# Patient Record
Sex: Male | Born: 1980 | Race: Black or African American | Hispanic: No | Marital: Single | Smoking: Current every day smoker
Health system: Southern US, Community
[De-identification: ages and names within clinical notes are randomized; demographics above are authoritative.]

## PROBLEM LIST (undated history)

## (undated) DIAGNOSIS — C801 Malignant (primary) neoplasm, unspecified: Secondary | ICD-10-CM

## (undated) DIAGNOSIS — I10 Essential (primary) hypertension: Secondary | ICD-10-CM

## (undated) DIAGNOSIS — N289 Disorder of kidney and ureter, unspecified: Secondary | ICD-10-CM

## (undated) DIAGNOSIS — F32A Depression, unspecified: Secondary | ICD-10-CM

## (undated) DIAGNOSIS — F419 Anxiety disorder, unspecified: Secondary | ICD-10-CM

## (undated) DIAGNOSIS — E119 Type 2 diabetes mellitus without complications: Secondary | ICD-10-CM

## (undated) HISTORY — DX: Depression, unspecified: F32.A

## (undated) HISTORY — DX: Anxiety disorder, unspecified: F41.9

---

## 2005-11-28 ENCOUNTER — Emergency Department (HOSPITAL_COMMUNITY): Admission: EM | Admit: 2005-11-28 | Discharge: 2005-11-29 | Payer: Self-pay | Admitting: Emergency Medicine

## 2007-03-28 ENCOUNTER — Inpatient Hospital Stay (HOSPITAL_COMMUNITY): Admission: EM | Admit: 2007-03-28 | Discharge: 2007-03-31 | Payer: Self-pay | Admitting: Emergency Medicine

## 2007-03-28 ENCOUNTER — Ambulatory Visit: Payer: Self-pay | Admitting: Oncology

## 2007-03-29 ENCOUNTER — Encounter (INDEPENDENT_AMBULATORY_CARE_PROVIDER_SITE_OTHER): Payer: Self-pay | Admitting: Interventional Radiology

## 2007-03-31 ENCOUNTER — Ambulatory Visit: Payer: Self-pay | Admitting: Oncology

## 2007-04-14 ENCOUNTER — Ambulatory Visit: Payer: Self-pay | Admitting: Thoracic Surgery

## 2007-04-21 ENCOUNTER — Encounter: Admission: RE | Admit: 2007-04-21 | Discharge: 2007-04-21 | Payer: Self-pay | Admitting: Thoracic Surgery

## 2007-05-05 ENCOUNTER — Ambulatory Visit: Payer: Self-pay | Admitting: Thoracic Surgery

## 2007-05-10 ENCOUNTER — Encounter: Payer: Self-pay | Admitting: Thoracic Surgery

## 2007-05-10 ENCOUNTER — Ambulatory Visit: Payer: Self-pay | Admitting: Thoracic Surgery

## 2007-05-10 ENCOUNTER — Inpatient Hospital Stay (HOSPITAL_COMMUNITY): Admission: RE | Admit: 2007-05-10 | Discharge: 2007-05-16 | Payer: Self-pay | Admitting: Thoracic Surgery

## 2007-05-20 ENCOUNTER — Ambulatory Visit: Payer: Self-pay | Admitting: Thoracic Surgery

## 2007-05-20 ENCOUNTER — Encounter: Admission: RE | Admit: 2007-05-20 | Discharge: 2007-05-20 | Payer: Self-pay | Admitting: Thoracic Surgery

## 2007-06-02 ENCOUNTER — Encounter: Admission: RE | Admit: 2007-06-02 | Discharge: 2007-08-31 | Payer: Self-pay | Admitting: Thoracic Surgery

## 2007-06-03 ENCOUNTER — Ambulatory Visit: Payer: Self-pay | Admitting: Thoracic Surgery

## 2007-06-03 ENCOUNTER — Encounter: Admission: RE | Admit: 2007-06-03 | Discharge: 2007-06-03 | Payer: Self-pay | Admitting: Thoracic Surgery

## 2007-06-04 ENCOUNTER — Ambulatory Visit: Payer: Self-pay | Admitting: Oncology

## 2007-06-30 ENCOUNTER — Encounter: Admission: RE | Admit: 2007-06-30 | Discharge: 2007-06-30 | Payer: Self-pay | Admitting: Thoracic Surgery

## 2007-06-30 ENCOUNTER — Ambulatory Visit: Payer: Self-pay | Admitting: Thoracic Surgery

## 2007-07-06 ENCOUNTER — Encounter: Admission: RE | Admit: 2007-07-06 | Discharge: 2007-08-04 | Payer: Self-pay | Admitting: Physician Assistant

## 2007-07-28 ENCOUNTER — Ambulatory Visit: Payer: Self-pay | Admitting: Thoracic Surgery

## 2007-07-28 ENCOUNTER — Encounter: Admission: RE | Admit: 2007-07-28 | Discharge: 2007-07-28 | Payer: Self-pay | Admitting: Thoracic Surgery

## 2007-09-03 ENCOUNTER — Ambulatory Visit: Payer: Self-pay | Admitting: Oncology

## 2007-10-05 ENCOUNTER — Ambulatory Visit: Payer: Self-pay | Admitting: Thoracic Surgery

## 2007-10-05 ENCOUNTER — Encounter: Admission: RE | Admit: 2007-10-05 | Discharge: 2007-10-05 | Payer: Self-pay | Admitting: Thoracic Surgery

## 2007-12-15 ENCOUNTER — Encounter: Admission: RE | Admit: 2007-12-15 | Discharge: 2007-12-15 | Payer: Self-pay | Admitting: Thoracic Surgery

## 2007-12-15 ENCOUNTER — Ambulatory Visit: Payer: Self-pay | Admitting: Thoracic Surgery

## 2008-03-03 ENCOUNTER — Ambulatory Visit: Payer: Self-pay | Admitting: Oncology

## 2008-06-21 ENCOUNTER — Ambulatory Visit: Payer: Self-pay | Admitting: Thoracic Surgery

## 2008-06-21 ENCOUNTER — Encounter: Admission: RE | Admit: 2008-06-21 | Discharge: 2008-06-21 | Payer: Self-pay | Admitting: Thoracic Surgery

## 2008-12-19 ENCOUNTER — Encounter: Admission: RE | Admit: 2008-12-19 | Discharge: 2008-12-19 | Payer: Self-pay | Admitting: Thoracic Surgery

## 2008-12-19 ENCOUNTER — Ambulatory Visit: Payer: Self-pay | Admitting: Thoracic Surgery

## 2009-03-15 IMAGING — CT CT BIOPSY
1 series · 16 of 30 positions shown, 20 images · non-contrast
Comparison: none

CLINICAL DATA: Wilms tumor. Resection of   thoracic metastasis 20 years ago.
Right apical mass.

CT-guided lung aspiration and core biopsy:
TECHNIQUE: Limited axial scans through the upper thorax were obtained and an
appropriate skin entry site was determined. Skin site was marked, prepped with
Betadine, and draped in usual sterile fashion, and infiltrated locally with 1%
lidocaine. Intravenous fentanyl and Versed were administered as conscious
sedation during continuous cardiorespiratory monitoring by the radiology RN.
Under intermittent CT guidance a 17-gauge   trocar needle was advanced to the
margin of the right apical lung mass using a posterior intercostal approach.
Once needle tip position was confirmed, coaxial 18-gauge core and 20-gauge
aspiration biopsy samples were obtained. An aspiration biopsy was then obtained
through the guiding needle itself. Postprocedure scans show no pneumothorax or
regional hemorrhage. Patient tolerated procedure well.

[Series 3: routine chest · axial · 0.56mm/px · z∈[-108,-18]mm · 16 of 66 slices shown, 20 images]
[im 3/66  mediastinal]
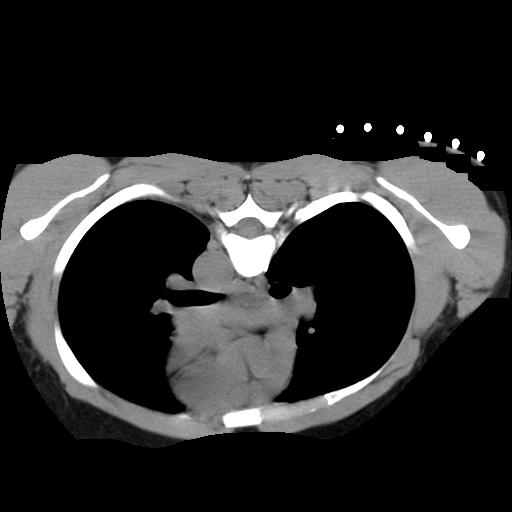
[im 3/66  lung]
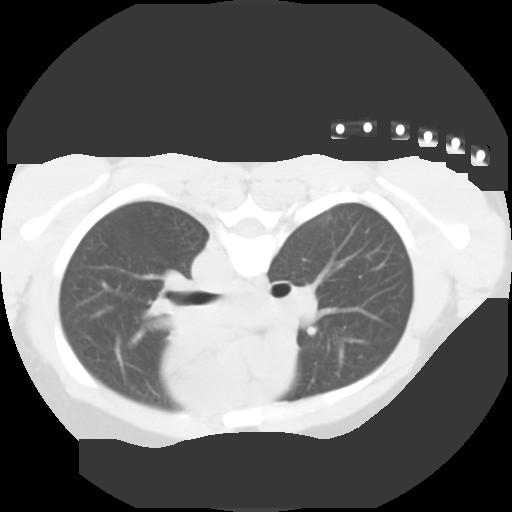
[im 8/66  lung]
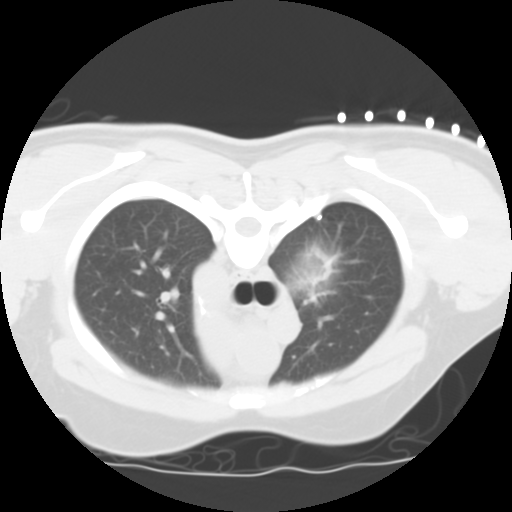
[im 13/66  lung]
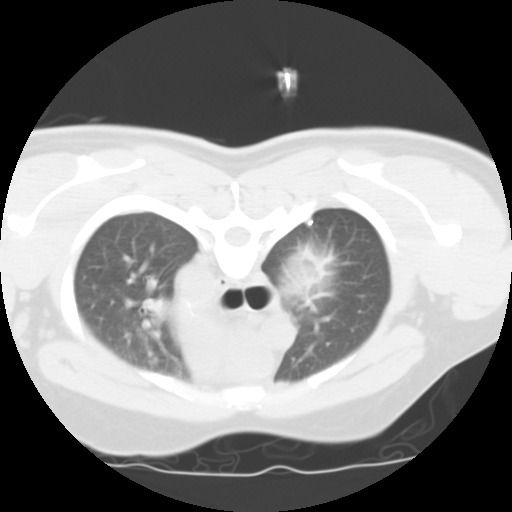
[im 15/66  lung]
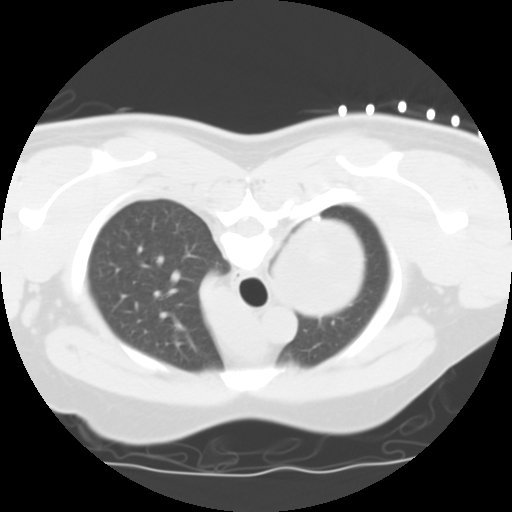
[im 17/66  mediastinal]
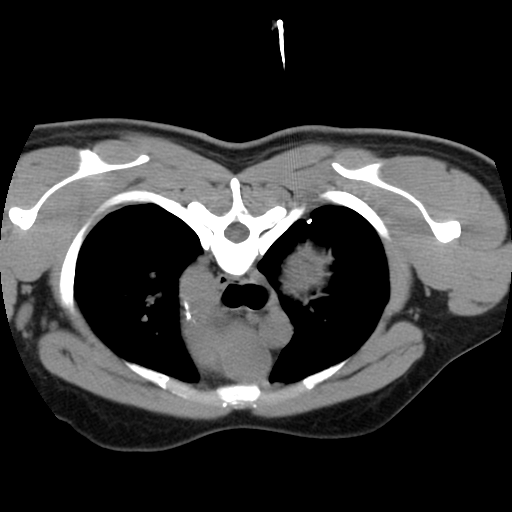
[im 17/66  lung]
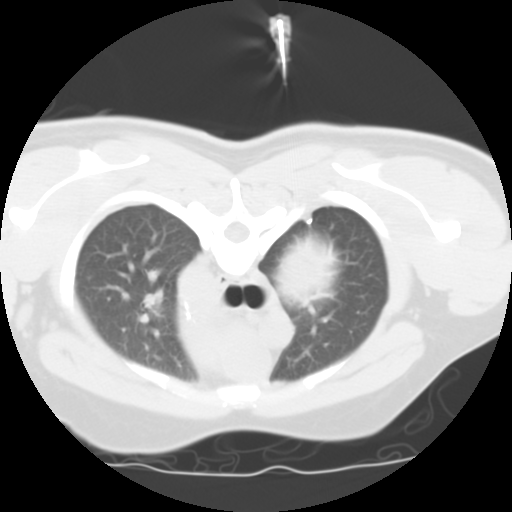
[im 22/66  lung]
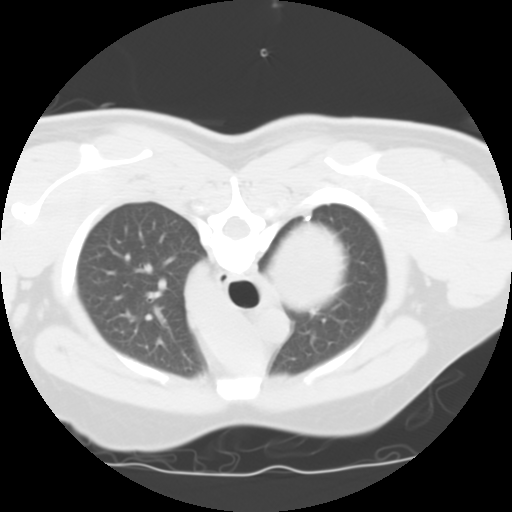
[im 27/66  lung]
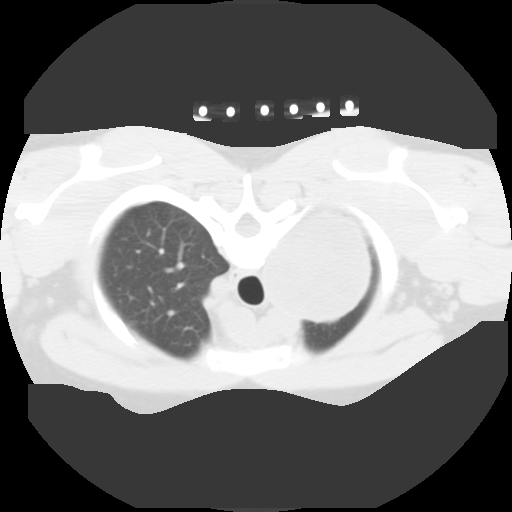
[im 32/66  lung]
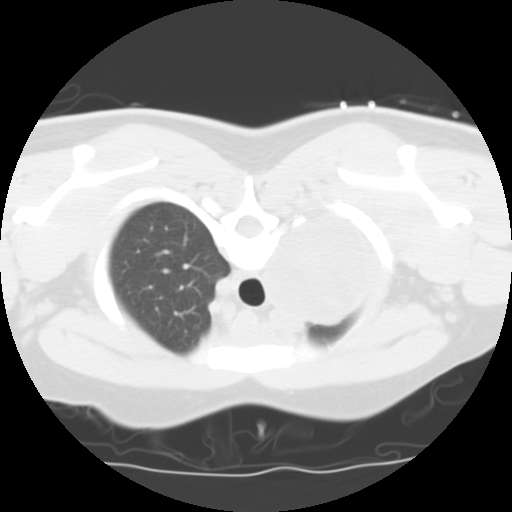
[im 34/66  mediastinal]
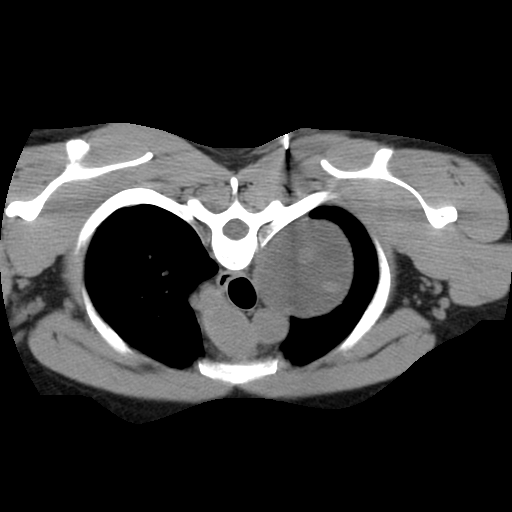
[im 34/66  lung]
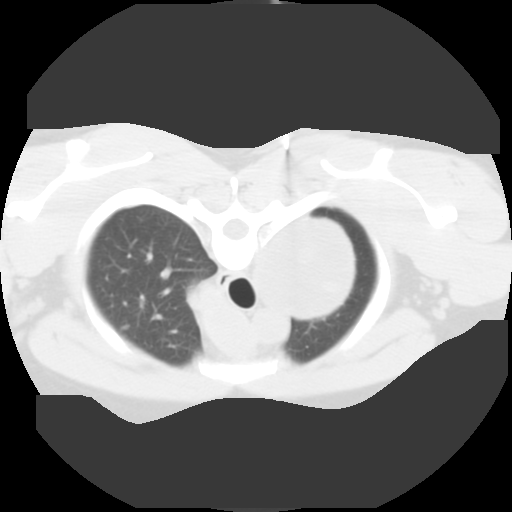
[im 39/66  lung]
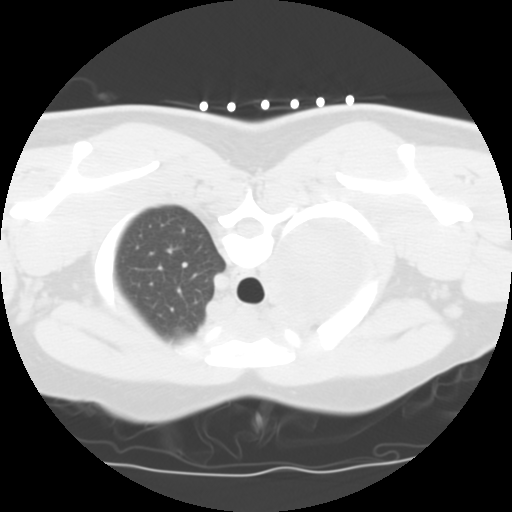
[im 41/66  lung]
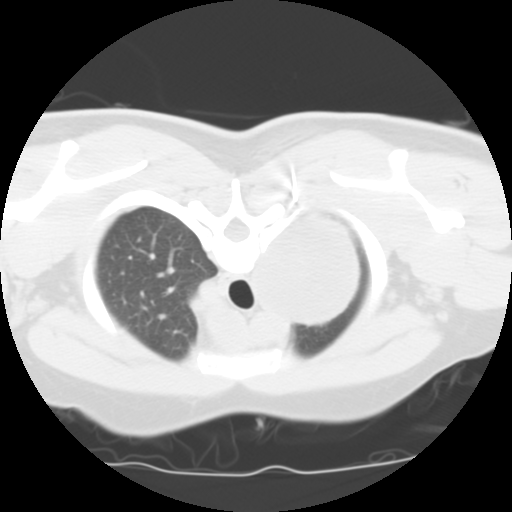
[im 46/66  lung]
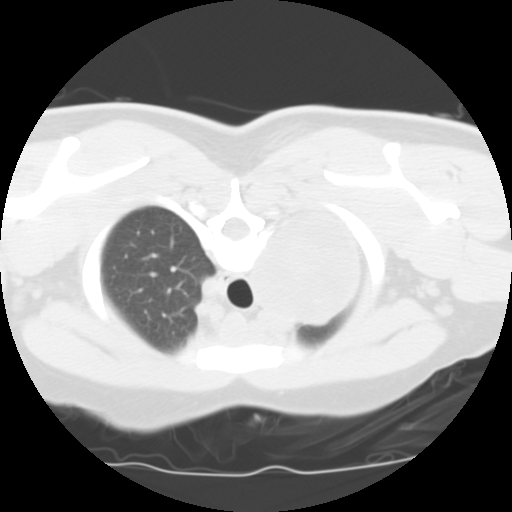
[im 49/66  mediastinal]
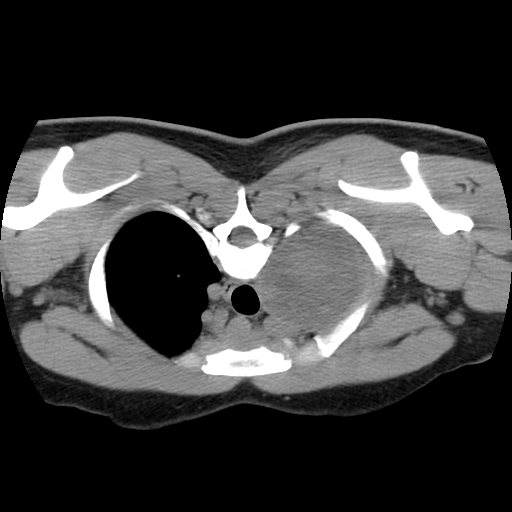
[im 49/66  lung]
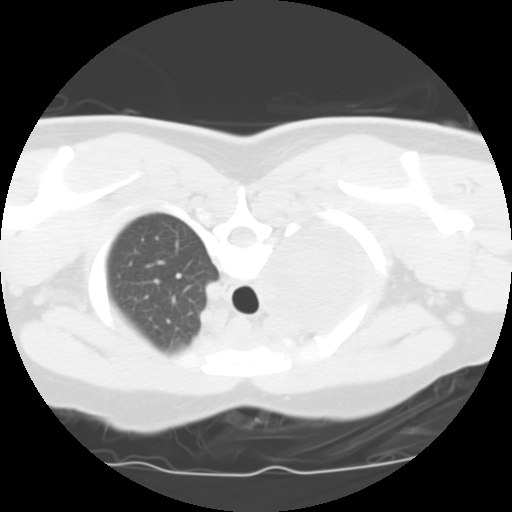
[im 53/66  lung]
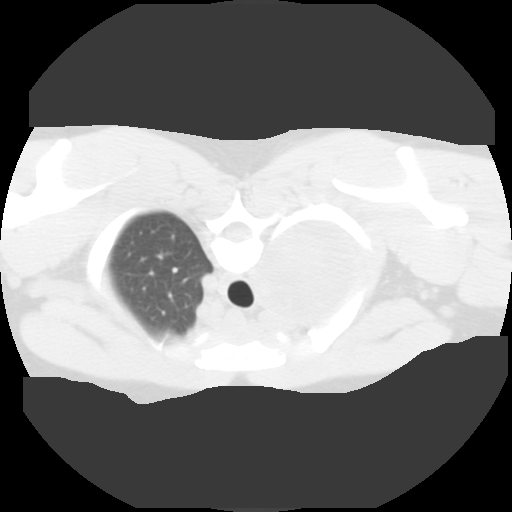
[im 58/66  lung]
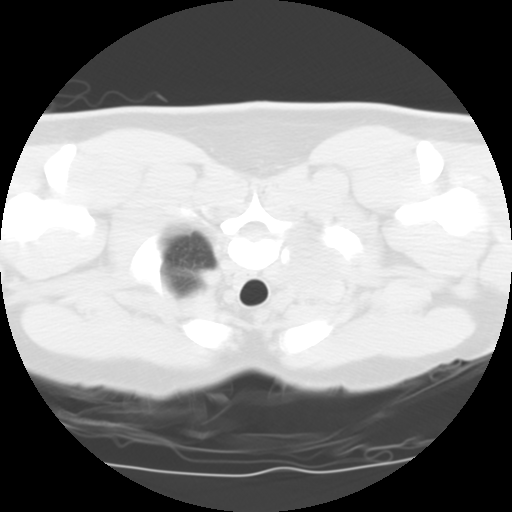
[im 63/66  lung]
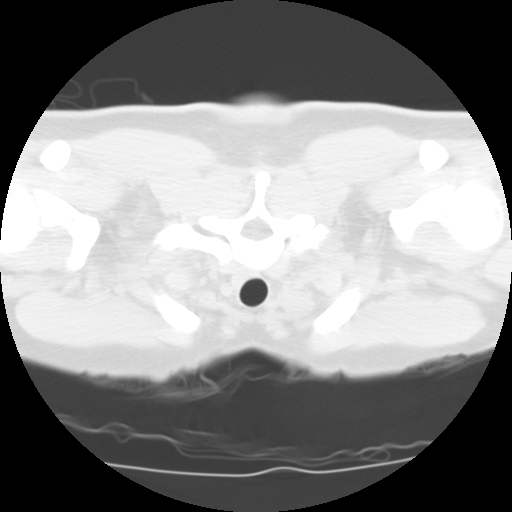

[16 of 30 positions shown; findings below may reference images not displayed]

IMPRESSION: 1. Technically successful CT guided aspiration and core lung biopsy

## 2009-03-16 IMAGING — CT CT ABDOMEN W/ CM
2 of 5 series · 16 of 46 positions shown, 18 images · IV contrast ([ID]/WATER & 100 ML OMNI 300)
Comparison: none

HISTORY: Right lung mass status post biopsy on [DATE], past history Manne
tumor, left nephrectomy, right thoracotomy, now with right flank pain

[Series 2: routine abdomen · axial · 0.70mm/px · z∈[-374,-54]mm · 13 of 72 slices shown, 15 images]
[im 4/72  soft-tissue]
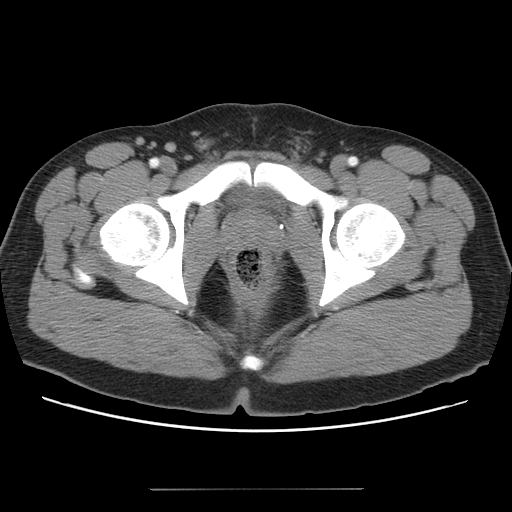
[im 4/72  bone]
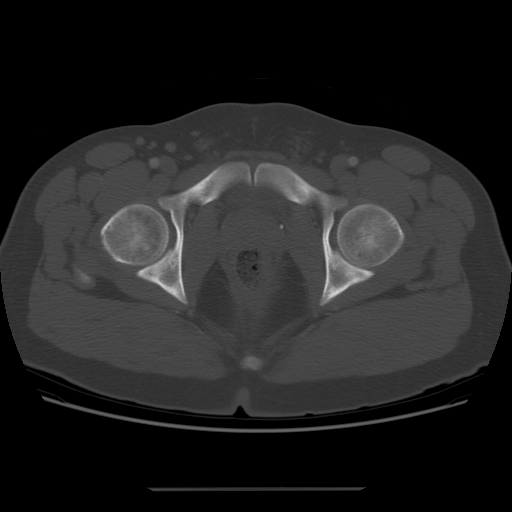
[im 8/72  soft-tissue]
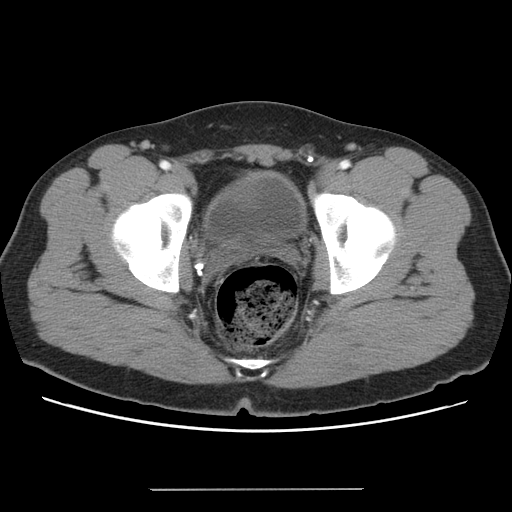
[im 16/72  soft-tissue]
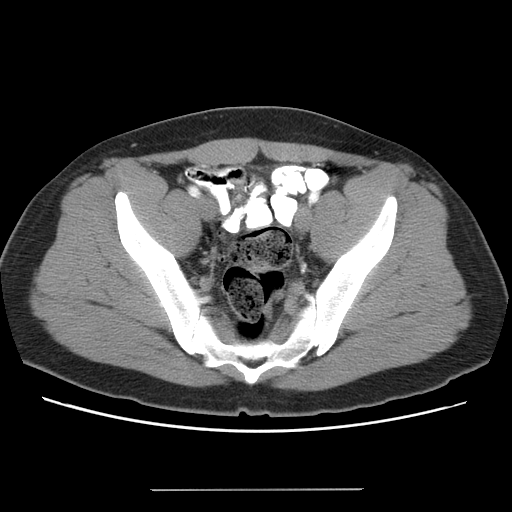
[im 20/72  soft-tissue]
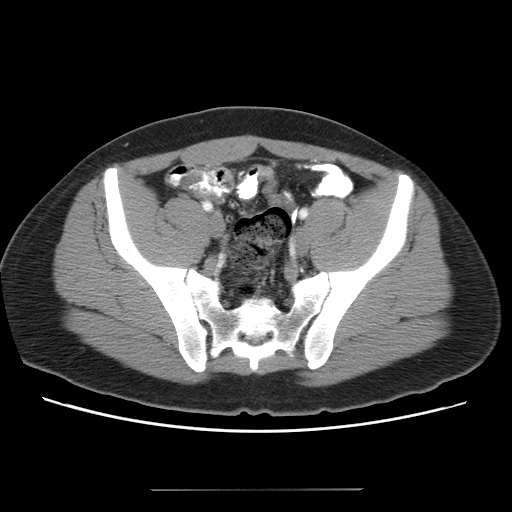
[im 24/72  soft-tissue]
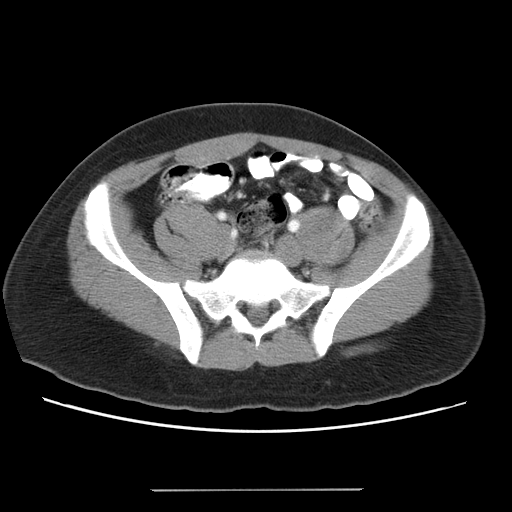
[im 32/72  soft-tissue]
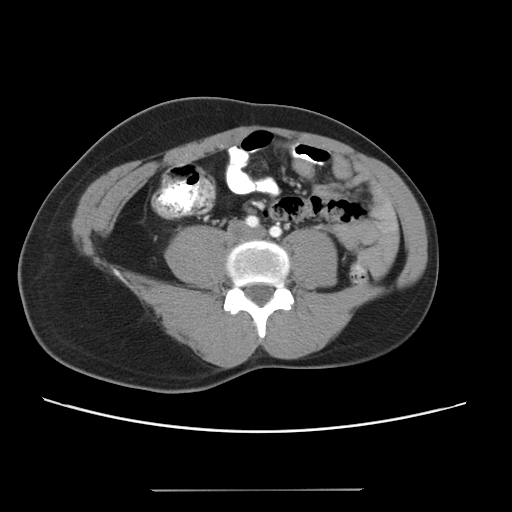
[im 36/72  soft-tissue]
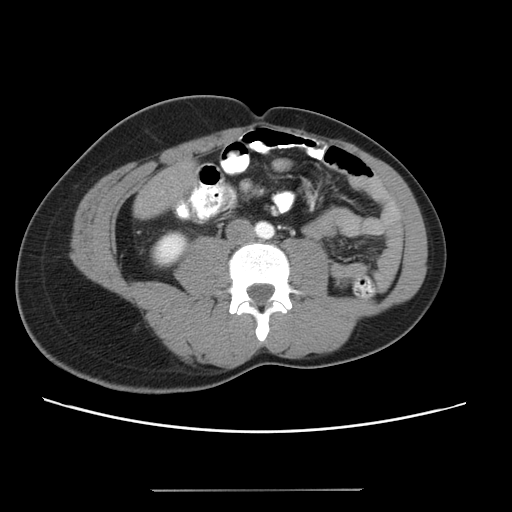
[im 40/72  soft-tissue]
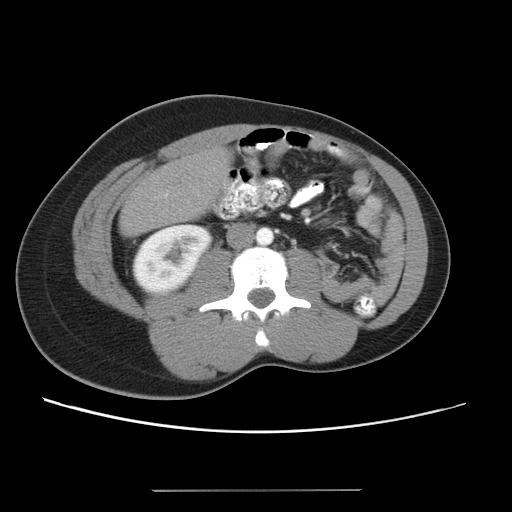
[im 48/72  soft-tissue]
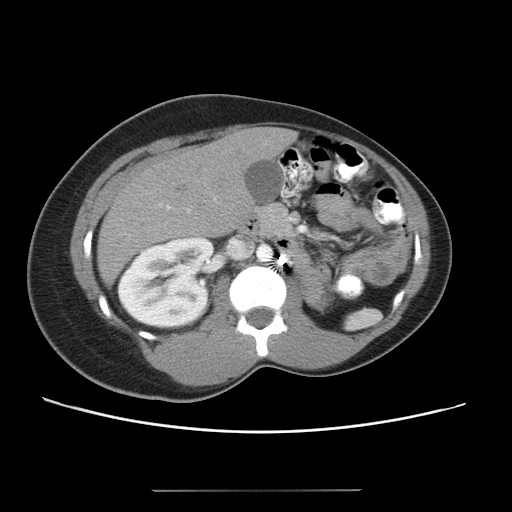
[im 48/72  bone]
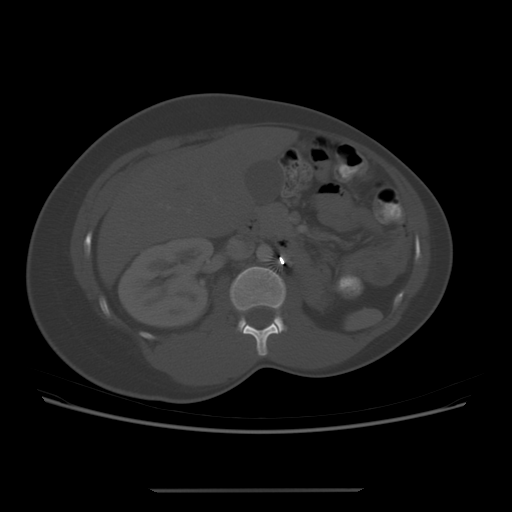
[im 52/72  soft-tissue]
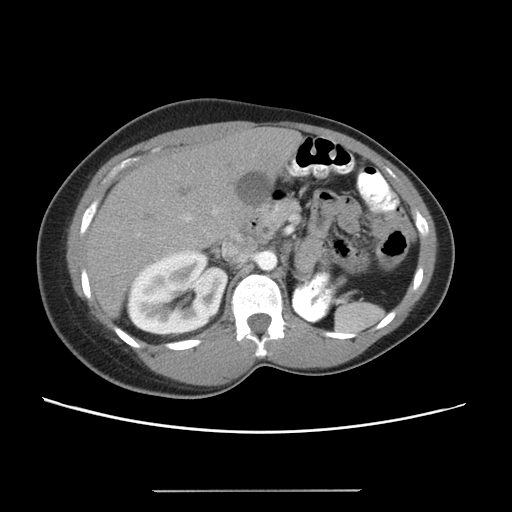
[im 56/72  soft-tissue]
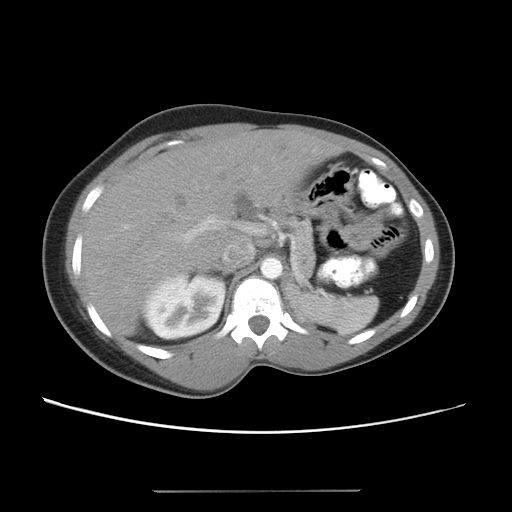
[im 64/72  soft-tissue]
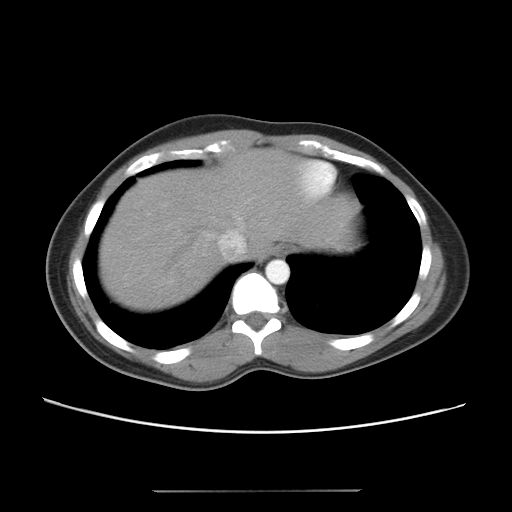
[im 68/72  soft-tissue]
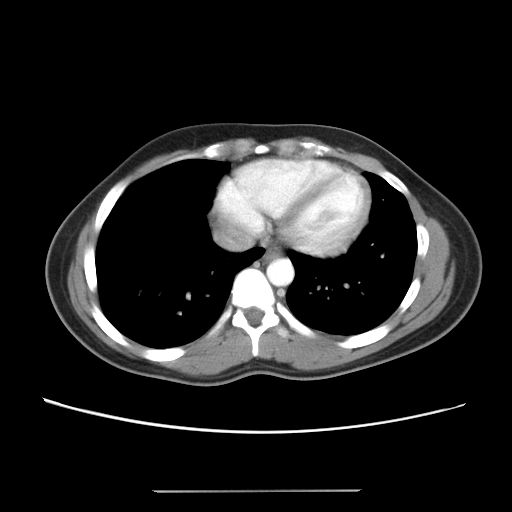

[Series 401: reformatted · coronal · 0.82mm/px · 3 of 101 slices shown]
[im 34/101  soft-tissue]
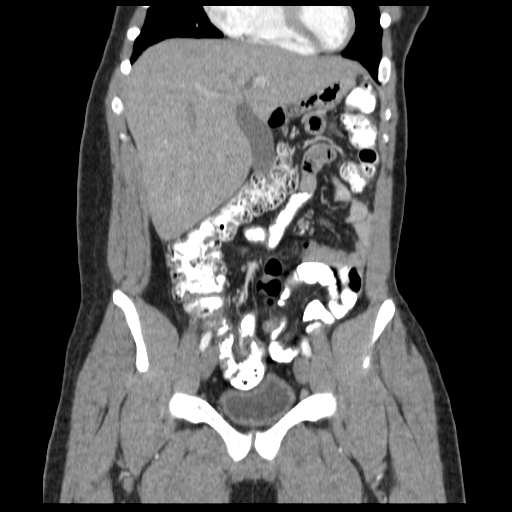
[im 45/101  soft-tissue]
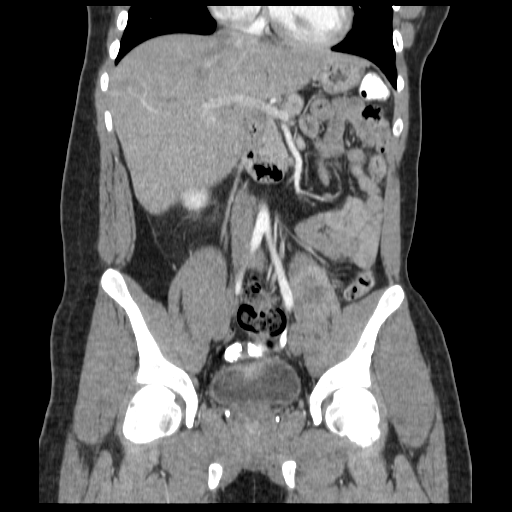
[im 56/101  soft-tissue]
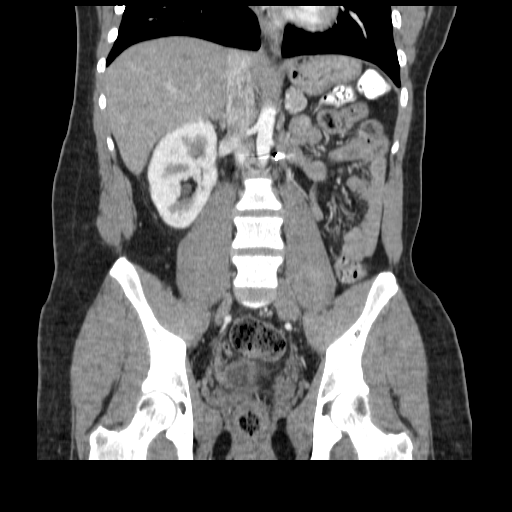

[16 of 46 positions shown; findings below may reference images not displayed]

CT ABDOMEN AND PELVIS WITH CONTRAST:

Multidetector helical CT imaging abdomen and pelvis performed.
Sagittal and coronal images are reconstructed from the axial data set.
Exam utilized dilute oral contrast and 100 cc Qmnipaque-CXX.
Correlation made with prior CT chest of 03/28/2007.

CT ABDOMEN:

Lung bases clear.
Status post left nephrectomy.
Poorly defined area of hypervascular enhancement identified posterior aspect
right lobe of liver, 14 x 14 mm image 13, nonspecific.
This could represent an atypical meningioma or hypervascular tumor/metastatic
focus.
This requires further characterization.
Remainder of liver, spleen, pancreas, and left adrenal gland normal.
Right adrenal gland is not definitely visualized, though there is prolapse of
tail of pancreas and colon into left renal fossa, which could obscure a left
adrenal gland.
Stomach and upper abdominal bowel loops otherwise normal appearance.
Compensatory hypertrophy of right kidney without focal mass or hydronephrosis.
No upper abdominal mass, adenopathy, free fluid, or inflammatory process. 
Few scattered normal sized mesenteric lymph nodes.
No ureteral calcification or dilatation.
IMPRESSION: Status post left nephrectomy with compensatory hypertrophy right kidney.
Hypervascular mass posterior aspect right lobe liver, 1.4 x 1.4 x 1.6 cm,
question atypical hemangioma versus hypervascular mass/tumor/metastasis.
Characterization of this lesion by multiphase MR imaging of the abdomen with and
without contrast recommended.
No other upper abdominal abnormalities.

CT PELVIS:

No distal ureteral calcification or dilatation.
Bladder incompletely distended, wall minimally prominent.
Bilateral pelvic phleboliths.
Increased stool rectosigmoid colon.
Normal appendix.
Large and small bowel loops otherwise unremarkable.
No pelvic mass, adenopathy, free fluid, or inflammatory process.
No bone lesions.
IMPRESSION: Mild constipation.
No other pelvic abnormalities.

## 2009-09-14 ENCOUNTER — Ambulatory Visit: Payer: Self-pay | Admitting: Thoracic Surgery

## 2010-10-17 ENCOUNTER — Emergency Department (HOSPITAL_COMMUNITY)
Admission: EM | Admit: 2010-10-17 | Discharge: 2010-10-17 | Payer: Self-pay | Source: Home / Self Care | Admitting: Family Medicine

## 2010-10-20 ENCOUNTER — Encounter: Payer: Self-pay | Admitting: Thoracic Surgery

## 2010-11-10 ENCOUNTER — Emergency Department (HOSPITAL_COMMUNITY)
Admission: EM | Admit: 2010-11-10 | Discharge: 2010-11-10 | Disposition: A | Discharge status: Home / Self Care | Payer: 59 | Attending: Emergency Medicine | Admitting: Emergency Medicine

## 2010-11-10 DIAGNOSIS — Y929 Unspecified place or not applicable: Secondary | ICD-10-CM | POA: Insufficient documentation

## 2010-11-10 DIAGNOSIS — S61209A Unspecified open wound of unspecified finger without damage to nail, initial encounter: Secondary | ICD-10-CM | POA: Insufficient documentation

## 2010-11-10 DIAGNOSIS — Z79899 Other long term (current) drug therapy: Secondary | ICD-10-CM | POA: Insufficient documentation

## 2010-11-10 DIAGNOSIS — Z85528 Personal history of other malignant neoplasm of kidney: Secondary | ICD-10-CM | POA: Insufficient documentation

## 2010-11-10 DIAGNOSIS — W268XXA Contact with other sharp object(s), not elsewhere classified, initial encounter: Secondary | ICD-10-CM | POA: Insufficient documentation

## 2011-02-11 NOTE — Discharge Summary (Signed)
[name], [name]                  ACCOUNT NO.:  [id number]   MEDICAL RECORD NO.:  [id number]          PATIENT TYPE:  INP   LOCATION:  2025                         FACILITY:  [facility]   PHYSICIAN:  [name], M.D. DATE OF BIRTH:  [birth year]   DATE OF ADMISSION:  05/10/2007  DATE OF DISCHARGE:  05/14/2007                               DISCHARGE SUMMARY   HISTORY OF PRESENT ILLNESS:  The patient is a 30 year old black male who  was seen in thoracic surgical consultation by Dr. Edwyna Shell for a right  lung mass.  The patient has a known history of previous Wilms' tumor,  status post left nephrectomy and right thoracotomy.  He is also status  post chemotherapy.  Recently, he developed right flank pain, and was  seen in the emergency room.  A chest x-ray and confirming CT scan  revealed right apical mass that was 6 to 8 cm in size, which evidenced  no bony destruction.  A needle biopsy was done, and this revealed an  atypical tumor, probably schwannoma.  The patient was admitted this  hospitalization for further resection.   PAST MEDICAL HISTORY:  Negative with the exception of the above.   MEDICATIONS PRIOR TO ADMISSION:  None.   Family history, social history, review of systems, and physical exam:  Please see the history and physical done at the time of admission.   HOSPITAL COURSE:  The patient was admitted electively and on May 07, 2007, taken to the operating room, at which time he underwent a right  thoracotomy with resection of a schwannoma or apical tumor.   POSTOPERATIVE HOSPITAL COURSE:  The patient has overall done well.  All  routine lines, monitors and drainage devices have been discontinued in  the standard fashion.  The patient has had some right shoulder  discomfort with difficulty moving.  This has shown a  slow progress.  He  will be seen by occupational therapy prior to discharge.  Pathology does  reveal a schwannoma.  The specimen evidenced no evidence of  malignancy.  The patient's oxygen has been weaned, and he maintains adequate  saturations on room air.  His chest x-ray is felt to be improving in an  adequate manner consistent with postoperative changes, and had been  reviewed by Dr. Edwyna Shell on a daily basis. His overall status is felt to  be tentatively stable for discharge on the morning on May 15, 2007  pending morning round reevaluation.   DISCHARGE MEDICATIONS:  Tylox 1-2 q.4-6 hours as needed.   DISCHARGE INSTRUCTIONS:  The patient will receive written instructions  in regard to medications, activity, diet, wound care and follow up.   FOLLOWUP:  Will include Dr. Edwyna Shell on Thursday, August 21 at 3:45 with a  chest x-ray to be obtained prior to this appointment.   FINAL DIAGNOSIS:  Right lung schwannoma status post resection.   OTHER DIAGNOSES:  Include:  1. Previous history of Wilms' tumor.  2. History of tobacco abuse.      [name], P.A.-C.      [name],  M.D.  Electronically Signed    WEG/MEDQ  D:  05/14/2007  T:  05/15/2007  Job:  [id number]

## 2011-02-11 NOTE — H&P (Signed)
[name], [name]                  ACCOUNT NO.:  [id number]   MEDICAL RECORD NO.:  [id number]          PATIENT TYPE:  INP   LOCATION:  NA                           FACILITY:  [facility]   PHYSICIAN:  [name], M.D. DATE OF BIRTH:  June 22, 1981   DATE OF ADMISSION:  DATE OF DISCHARGE:                              HISTORY & PHYSICAL   HISTORY OF PRESENT ILLNESS:  Right lung mass.   This 30 year old [place] American male at age had a left nephrectomy and  right thoracotomy for Wilms tumor.  He had metastatic disease to his  right chest.  Recently, he was complaining of right flank pain and went  to the emergency room and had a chest x-ray and then a CT scan that  showed a right apical mass that was 6-8 cm in size with no bony  destruction.  Needle biopsy was done and showed an atypical tumor,  probably an atypical schwannoma.  He has no neurological symptoms, no  dyspnea, no problems with bleeding.  He had a urinary tract infection  but that is resolved.   PAST MEDICAL HISTORY:  Negative except for above.  He is on no  medications.   SOCIAL HISTORY:  He smokes 10-15 cigarettes a day.  Does not drink  alcohol on a regular basis.  He is married.   FAMILY HISTORY:  He works for the city in [employer].   FAMILY HISTORY:  Negative for cancer and vascular disease.  He has four  children.   REVIEW OF SYSTEMS:  His weight has been stable.  CARDIAC:  No angina or  atrial fibrillation.  PULMONARY:  No hemoptysis or respiratory  infections.  He says he has brothers and sisters that do have asthma.  GI:  No nausea, vomiting, constipation, diarrhea, or GERD.  GU: See  history of present illness.  He has a hypertrophic right kidney.  VASCULAR:  No claudication, DVT, TIAs.  NEUROLOGICAL:  No headaches,  blackouts or seizures.  MUSCULOSKELETAL:  No joint pain or rash.  PSYCHIATRIC:  No psychiatric illness or nervousness.  HEMATOLOGIC:  No  problems with clotting or bleeding.   PHYSICAL EXAMINATION:  He is a well-developed [place] male in  no acute distress.  His blood pressure is 115/74, pulse 86, respirations  18, saturations were 96%.  HEENT:  Head:  Atraumatic.  Eyes:  Pupils equal, reactive to light and  accommodation.  Extraocular movements normal.  Ears:  Tympanic membranes  are intact.  Nose:  There is no septal deviation.  Mouth:  Without  lesions.  Uvula is in the midline.  NECK:  Supple without thyromegaly.  There is no supraclavicular or  axillary adenopathy.  CHEST:  Decreased breath sounds in the right upper lobe, normal breath  sounds the right lower lobe and the left chest.  HEART:  Regular sinus rhythm, no murmurs.  There is a right thoracotomy  scar.  ABDOMEN:  Soft.  There is no hepatosplenomegaly.  There are surgical  scars.  EXTREMITIES:  Pulses are 2+.  There is no clubbing or edema.  NEUROLOGICAL:  He is oriented x3.  Sensory and motor intact.  Cranial  nerves are intact.   IMPRESSION:  1. Probable schwannoma.  2. History of Wilms tumor status post resection and chemotherapy.      [name], M.D.  Electronically Signed     DPB/MEDQ  D:  05/06/2007  T:  05/07/2007  Job:  [id number]

## 2011-02-11 NOTE — Letter (Signed)
May 20, 2007   Leighton Roach. Truett Perna, M.D.  [address]- [facility]  [place] [address]   Re:  [name], [name]              DOB:  [birth year]   Dear Dr. Nida Boatman,   [name] came for followup today.  His chest x-ray is improved.  Removed  his sutures.  He is still having a lot of pain.  His blood pressure was  123/80, pulse 100, respirations 18, sats were 97%.   Overall, he is doing reasonably well.  He feels he is moving his  shoulder better than what he was in the hospital, but he has not had a  bowel movement, so we told him to try to cut down on his pain medication  and take a laxative.  Continue his work with his shoulder.   Plan to see him back again in two weeks with a chest x-ray.   [name], M.D.  Electronically Signed   DPB/MEDQ  D:  05/20/2007  T:  05/21/2007  Job:  [id number]

## 2011-02-11 NOTE — Assessment & Plan Note (Signed)
OFFICE VISIT   [name], [name]  DOB:  Aug 28, 1981                                        June 03, 2007  CHART #:  [id number]   Blood pressure is 122/73, pulse 82, respirations 18, and SATs were 98%.  His incision was well healed.  He works with physical therapy for his  neck pain and strength of his right arm and able to abduct his right arm  to approximately 90 degrees.  He is still having a moderate amount of  postthoracotomy pain, and we refilled his Lortab 7.5/500.  I have  advised him to continue his physical therapy.  I plan to see him back  again in four weeks to see if he is not strong enough to return to his  job given the amount of physical labor involved.   [name], M.D.  Electronically Signed   DPB/MEDQ  D:  06/03/2007  T:  06/03/2007  Job:  [id number]

## 2011-02-11 NOTE — Discharge Summary (Signed)
NAMECLEOTHA, TSANG                  ACCOUNT NO.:  [id number]   MEDICAL RECORD NO.:  [id number]          PATIENT TYPE:  INP   LOCATION:  5730                         FACILITY:  [facility]   PHYSICIAN:  Barnetta Chapel, MDDATE OF BIRTH:  12/09/1980   DATE OF ADMISSION:  03/28/2007  DATE OF DISCHARGE:  03/31/2007                               DISCHARGE SUMMARY   PRIMARY CARE PHYSICIAN:  Dr. Flonnie Overman.   DIAGNOSES:  1. Right upper lobe mass.  2. Right upper quadrant discomfort.   DISCHARGE MEDICATIONS:  1. Dilaudid 4 mg p.o. q.6h. p.r.n.  2. Phenergan 12.5 mg p.o. q.6h. p.r.n. nausea and vomiting.   CONSULTATIONS:  Oncology consult.   PROCEDURES:  CT guided biopsy of the right upper lobe mass.  The final  pathology is still pending.  The sample had to be sent out.  The primary  care Alejandro Gamel should please follow up with the results of the biopsy.   IMAGING STUDIES:  1. Chest x-ray that revealed right upper lobe mass.  2. CT scan of the abdomen and pelvis that revealed 14 x 14 mm lesion      at the right lobe of the liver.  This could be an  __________      hemangioma, hypervascular tumor, or metastasis.  There will be need      for an MRI as an outpatient.  The patient will also need a PET scan      as an outpatient to further evaluate the right upper lobe mass.   BRIEF HISTORY AND HOSPITAL COURSE:  Please refer to the H&P done on March 28, 2007.  The patient is a 30 year old male with a history of Wilms  tumor.  The patient had a left nephrectomy, thoracotomy, chemotherapy,  and radiation at [facility] for the Wilms tumor.  The patient has  been in fairly good state of health until recently when he presented  with low-grade fever and myalgias.  The chest x-ray done revealed a  right upper lobe mass.   The patient was admitted to the regular medical floor.  CT guided biopsy  was done.  The final pathology result is still pending.  The patient  also reported right upper quadrant  discomfort that comes on and off.  CAT scan of the abdomen and pelvis done was essentially as above.  There  will be need for an MRI to further characterize the lesion on the right  lobe of the liver.  The patient will also need a PET scan as part of the  workup for the right upper lobe mass.  Prior to presentation, the  patient was on Augmentin 875 mg p.o. b.i.d.  The antibiotic was  continued during his stay in the hospital.  The fever has resolved.  The  fatigue has also resolved.  The patient has done well and will be  discharged back home today to the care of the primary care Yerlin Gasparyan, Dr.  Flonnie Overman.   DISCHARGE PLANS:  1. Discharge the patient home today.  2. Follow up with the oncologist as soon as  possible.  3. Follow up with the primary care Tomeshia Pizzi, Dr. Flonnie Overman in a week.  4. The primary care Amos Gaber should please pursue the final report of      the pathology.  5. The patient will need an MRI of the liver as an outpatient.  He      will also need a PET scan as an outpatient.   DIET:  Regular diet.   ACTIVITY:  As tolerated.      Barnetta Chapel, MD  Electronically Signed     SIO/MEDQ  D:  03/31/2007  T:  03/31/2007  Job:  [id number]   cc:   Lucita Ferrara, MD  Leighton Roach. Truett Perna, M.D.

## 2011-02-11 NOTE — Assessment & Plan Note (Signed)
OFFICE VISIT   DEWEL, LOTTER  DOB:  07-19-1981                                        October 05, 2007  CHART #:  [id number]   Mr. Hanna came for follow up today.  He still has some numbness in his  right arm and some pain there, but his chest x-ray is stable with no  evidence of recurrence of his schwannoma.  I did start him on Neurontin  300 mg twice a day, and to go to 600 mg twice a day, to see if this is  helping him for his pain and numbness, but his overall strength is doing  better and he has gone back to work.  I will see him again in 2 month  for further follow up.  His blood pressure was 116/78, pulse 80,  respirations 18, saturations were 99%.   [name], M.D.  Electronically Signed   DPB/MEDQ  D:  10/05/2007  T:  10/06/2007  Job:  [id number]

## 2011-02-11 NOTE — Assessment & Plan Note (Signed)
OFFICE VISIT   KHAMBREL, AMSDEN  DOB:  05-02-1981                                        December 15, 2007  CHART #:  [id number]   Mr. Oran comes in today for two-month followup.  He is status post a  right thoracotomy with resection of schwannoma on May 10, 2007.  He  has done well since his last office visit.  He states that he continues  to have some pain in his elbow, particularly with lifting weights or  when he has been doing heavy lifting, but overall this is much improved  from his last visit.  He is still having some numbness, as well, but is  no longer taking the Neurontin.  He has no new complaints today.   Vital signs:  Blood pressure 124/80, pulse 66, respirations 18, O2 sat  97%.  On exam, his incision has healed well.  Lungs:  Clear to  auscultation.  Heart:  Regular rate and rhythm.   Chest x-ray has remained stable.  There is no evidence of recurrence of  tumor.   ASSESSMENT AND PLAN:  Dr. Edwyna Shell has also seen the patient today and we  will plan to see him back again in the office in two months with a chest  x-ray.  He is asked to call in the interim if he experiences problems or  has questions.   [name], M.D.  Electronically Signed   GC/MEDQ  D:  12/15/2007  T:  12/15/2007  Job:  [id number]   cc:   Dr. Georgina Pillion, [place] [facility], [place]

## 2011-02-11 NOTE — Assessment & Plan Note (Signed)
OFFICE VISIT   [name], [name]  DOB:  08-24-1981                                        June 30, 2007  CHART #:  [id number]   [name] came back complaining that he is still having a moderate amount  of chest pain and hydrocodone is ineffective.  He is on 5/500.  His  chest x-ray showed normal postoperative changes.  He still has numbness  in the on the outer aspect of his arm and really some pain in his chest  which is more post thoracotomy pain.  He is still undergoing physical  therapy and this seems to be helping him.  Because of the slow recovery,  I will give him off until November 15th prior to returning to work. I  told him to gradually try and increase his activity and increase his use  of his right arm.  I plan to see him back again in about 4 weeks with a  chest x-ray.   [name], M.D.  Electronically Signed   DPB/MEDQ  D:  06/30/2007  T:  07/01/2007  Job:  [id number]

## 2011-02-11 NOTE — Assessment & Plan Note (Signed)
OFFICE VISIT   BENEDICT, KUE  DOB:  [birth year]                                        June 21, 2008  CHART #:  [id number]   The patient's blood pressure was 129/88, pulse 71, respirations 18, and  sats were 99%.  Chest x-ray showed no evidence of recurrence.  There has  been years since his surgery.  He is doing well overall.  I will see him  again in 6 months with another chest x-ray.  He still has some mild pain  and numbness in his arm and some weakness, but overall, it continues to  improve.  We will see him back again in 6 months with a chest x-ray.   [name], M.D.  Electronically Signed   DPB/MEDQ  D:  06/21/2008  T:  06/21/2008  Job:  [id number]

## 2011-02-11 NOTE — Op Note (Signed)
[name], [name]                  ACCOUNT NO.:  [id number]   MEDICAL RECORD NO.:  [id number]          PATIENT TYPE:  INP   LOCATION:  3302                         FACILITY:  [facility]   PHYSICIAN:  [name], M.D. DATE OF BIRTH:  1981-06-23   DATE OF PROCEDURE:  05/10/2007  DATE OF DISCHARGE:                               OPERATIVE REPORT   PREOPERATIVE DIAGNOSIS:  Right posterior mediastinal apical tumor,  possible schwannoma.   POSTOPERATIVE DIAGNOSIS:  Schwannoma, T1-T2 area.   OPERATION PERFORMED:  1. Right thoracotomy  2. Resection of schwannoma or apical tumor.   SURGEON:  D.P. Edwyna Shell, MD   FIRST ASSISTANT:  Zenaida Niece, RNFA   ANESTHESIA:  General anesthesia.   DESCRIPTION OF PROCEDURE:  After percutaneous insertion of all  monitoring lines, the patient was turned to the right lateral  thoracotomy position.  A dual-lumen tube was inserted.  This patient had  a previous thoracotomy incision for metastatic Wilms tumor at age 45 and  we went through the same incision and extended posteriorly up  approximately 2 cm in the parascapular area.  The latissimus was divided  with electrocautery.  The serratus was reflected anteriorly.  The  rhomboid was divided with electrocautery and the scapula was elevated.  We dissected down to the 4th intercostal space and then went into the  4th intercostal space and at that time, there were a lot of adhesions.  We took the adhesions down off the 4th intercostal space with  electrocautery, but had to take the adhesions off the inferior portion  down with the Autosuture 45 stapler with 3 applications.  After the  adhesions had been taken down, we then took the lung off of the apical  tumor, which was readily apparent, taking it off with sharp and blunt  dissection.  One area of the tumor of the lung had to be stapled with a  no-knife 45 stapler.  Then we started the dissection of the apical  tumor, resecting it off the pleura,  dissecting it up superiorly,  inferiorly, medially and laterally until we reached the apex.  We were  able to get around the apex and strip the tumor down.  There was some  bleeding from a paravertebral vein and this was controlled with  electrocautery and then direct pressure.  The tumor was finally  dissected up and removed.  In order to get a good dissection of this, we  did also have to go through the 3rd intercostal space in order to free  this up by sharp and blunt dissection.  After the tumor had been  removed, all bleeding was electrocoagulated.  Some FloSeal was applied  to the area where the paravertebral vein was. Two chest tubes were  brought in through separate stab wounds and tied in place with 0 silk.  A single On-Q was placed in the  usual fashion.  A Marcaine block was done in the usual fashion.  Pericostal were used to close the 3rd and the 4th intercostal space, #1  Vicryl in the muscle layer, 2-0 Vicryl in the subcutaneous  tissue and  Ethicon skin clips.  The patient was returned to the recovery room in  stable condition.      [name], M.D.  Electronically Signed     DPB/MEDQ  D:  05/10/2007  T:  05/11/2007  Job:  [id number]   cc:   Leighton Roach. Truett Perna, M.D.

## 2011-02-11 NOTE — H&P (Signed)
NAMEALP, GOLDWATER                  ACCOUNT NO.:  [id number]   MEDICAL RECORD NO.:  [id number]          PATIENT TYPE:  INP   LOCATION:  5730                         FACILITY:  [facility]   PHYSICIAN:  Corinna L. Lendell Caprice, MDDATE OF BIRTH:  09-25-1981   DATE OF ADMISSION:  03/28/2007  DATE OF DISCHARGE:                              HISTORY & PHYSICAL   CHIEF COMPLAINT:  Fever and body aches.   HISTORY OF PRESENT ILLNESS:  Mr. Stave is a 30 year old black male  patient of Dr. Flonnie Overman who presents to the emergency room after having  gone to an urgent care with the above complaint.  He has had these low-  grade fevers for about a month.  He has had generalized malaise,  fatigue, myalgias.  He also has recently developed a hacking cough.  He  had some frontal headaches and was recently prescribed Augmentin for  sinusitis 3 days ago.  He had a chest x-ray done at urgent care which  showed a right upper lobe mass and he was therefore referred to the  emergency room.  The patient's has a history of what sounds like Wilms  tumor and he had a left nephrectomy as a child as well as a right  thoracotomy, which I presume was for resection of cancer.  He has had no  problems since then and otherwise has no medical history, nor does he  take chronic medications.  He has been taking Tylenol frequently for the  pains and headaches and fevers.   PAST MEDICAL HISTORY:  As above.   MEDICATIONS:  Augmentin and Tylenol.   SOCIAL HISTORY:  The patient is married.  He smokes about 10-15  cigarettes a day.  He does not use drugs.  He drinks occasionally.   FAMILY HISTORY:  Negative for cancer.   REVIEW OF SYSTEMS:  As above, otherwise negative.   PHYSICAL EXAMINATION:  VITAL SIGNS:  His temperature is 100.9 orally,  blood pressure 104/70, pulse 76, respiratory rate 14, oxygen saturation  96% on room air.  GENERAL:  The patient is well-nourished, well-developed, in no acute  distress.  He is somewhat  flushed.  HEENT:  Normocephalic, atraumatic.  Pupils equal, round, reactive to  light.  Sclerae nonicteric.  Moist mucous membranes.  NECK:  Supple.  No lymphadenopathy.  LUNGS:  Clear to auscultation bilaterally without wheezes, rhonchi or  rales.  CARDIOVASCULAR:  Regular rate and rhythm without murmurs, gallops or  rubs.  ABDOMEN:  Normal bowel sounds, slight left-sided tenderness.  GENITOURINARY AND RECTAL:  Deferred.  EXTREMITIES:  No clubbing, cyanosis or edema.  SKIN:  He has a dark papule which appears to be eschar and surrounding  erythema which the patient reports he thinks was a spider bite but he  did not witness the actual spider.  NEUROLOGIC:  Alert and oriented.  Cranial nerves and sensory motor exam  intact.  LYMPHATICS:  No lymphadenopathy.   LABORATORIES:  UA shows negative nitrite, negative leukocyte esterase,  30 protein, negative blood, negative ketones.  Complete metabolic panel  unremarkable.  Lipase 16.  CBC is  unremarkable.  Chest x-ray shows a  right upper lobe mass; final reading is pending.  CT of the chest shows  a 5.9 x 6.1 cm round mass in the right apex, appears to be soft tissue  in origin.  Linear calcification or suture line along the posterior  margin of this lesion.  Shoddy lymph nodes in both axillary regions are  at the upper limits of normal for size.  No mediastinal or hilar  lymphadenopathy.  Tiny parenchymal nodule in the right upper lobe.  Tiny  subpleural nodule in left upper lobe.  A 1.3-cm hypervascular focus in  the right posterior liver.   ASSESSMENT AND PLAN:  1. 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      I will discuss with the interventional radiology in the morning for      CT-guided biopsy.  The patient will be admitted.  2. Fever, possibly sinusitis, possibly related to the tumor.  Continue      Augmentin for now.      Corinna L. Lendell Caprice, MD  Electronically Signed     CLS/MEDQ  D:   03/28/2007  T:  03/29/2007  Job:  [id number]   cc:   Lucita Ferrara, MD

## 2011-02-11 NOTE — Consult Note (Signed)
NAMEAUTREY, HUMAN                  ACCOUNT NO.:  [id number]   MEDICAL RECORD NO.:  [id number]          PATIENT TYPE:  INP   LOCATION:  5730                         FACILITY:  [facility]   PHYSICIAN:  Leighton Roach. Truett Perna, M.D. DATE OF BIRTH:  1981/03/13   DATE OF CONSULTATION:  03/30/2007  DATE OF DISCHARGE:                                 CONSULTATION   REASON FOR CONSULTATION:  Lung lesion.   HISTORY OF PRESENT ILLNESS:  [name] is a pleasant 30 year old African-  American male we were asked to see for evaluation of a right lung mass.  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  With  recurrence to the right lung one year later requiring again chemotherapy  and radiation.  All the records pertinent to this diagnosis.  He  presented to [facility], and later transferred to the [facility]  emergency room, with a 25-month history of low-grade fever, malaise,  fatigue, arthralgia, dry cough and frontal headaches.  Chest x-ray at  urgent care revealed a large right upper lobe mass.  On admission, a CT  of the chest with contrast was performed on March 28, 2007, positive for  a 5.9 x 6.1 cm right apical mass, soft tissue in origin, with linear  calcification or suture line along the posterior margin of this lesion.  Shotty lymph nodes in both axillary regions were seen.  No mediastinal  or hilar lymphadenopathy.  Moreover, a tiny parenchymal nodule in the  right upper lobe was seen, as well as a tiny pleural left upper lobe  nodule.  A 1.3 cm hypervascular focus in the right posterior liver  region was observed, as well.  A CT-guided core biopsy was performed on  March 29, 2007.  The results are currently pending.  CT of the abdomen  and pelvis has been ordered.   PAST MEDICAL HISTORY:  1. History of Wilms' tumor.  2. Tobacco use, quitting 2 weeks ago.  3. Recent sinusitis, treated with Augmentin.  4.  Hypercholesterolemia per patient report.   SURGERY:  1. Status post left nephrectomy at [facility] at age 67.  2. Status post metastatic lung resection at [facility] at age 44.  3. CT-guided fine needle aspiration-core biopsy, March 29, 2007 by      interventional radiology of the right lung lesion.   ALLERGIES:  No known drug allergies.   CURRENT MEDICATIONS:  1. Augmentin 875 mg daily.  2. Tylenol.  3. Dilaudid.  4. Ativan.  5. Maalox.  6. Phenergan.  7. Ambien p.r.n.   REVIEW OF SYSTEMS:  Remarkable for a low-grade fever upon admission of  up to 100.4 degrees Fahrenheit.  Currently, this has resolved.  He also  came with fatigue, dyspnea on exertion, a hacking cough since early  June, as well as right costodiaphragmatic pain with movement since  Friday.  He also complains of bloating, arthralgias and frontal  headaches on admission, which has now been resolved.  The rest of the  review of systems is negative.   FAMILY HISTORY:  Negative for cancer.  Mother and father both alive and  in good health.  He also has 2 siblings in good health.   SOCIAL HISTORY:  The patient is married.  He has 2 biological children  in good health.  He has 2 other children from his wife's side.  He is a  Engineer, mining.  Quit 2 weeks ago the use of 10 cigarettes a day for  at least 10 years.  He drinks about 2 beers a day.  Lives in [place].   PHYSICAL EXAMINATION:  GENERAL:  This is a well-developed, well-  nourished 30 year old African-American male in no acute distress, alert  and oriented x3.  VITAL SIGNS:  Blood pressure 115/83, pulse 52, respirations 20,  temperature 98.7, pulse oximetry 94% on room air.  Weight 70 kg.  Height  65 inches.  HEENT:  Normocephalic and atraumatic.  Pupils equal, round and reactive  to light and accommodation.  Oral mucosa without thrush or lesions.  NECK:  Supple.  No cervical or supraclavicular masses.  LUNGS:  Clear to auscultation bilaterally.  No axillary  masses.  There  is a well-healed right posterior scar where surgery took place 20 years  ago for resection of the lung tissue.  CARDIOVASCULAR:  Regular rate and rhythm with no murmurs, rubs, or  gallops.  There is apparently a split S2.  ABDOMEN:  Soft.  Slightly tender in the right upper quadrant.  His  abdomen shows asymmetry after nephrectomy at age 83.  No other  abnormalities are seen.  No palpable spleen or liver.  GU/RECTAL:  Negative for testicular mass.  EXTREMITIES:  No clubbing or cyanosis, no edema.  SKIN:  Without lesions or bruising.  No petechial rash.  Multiple  tattoos.  NEUROLOGIC:  Nonfocal.   LABORATORY DATA:  Hemoglobin 16.7, hematocrit 49, white count 10.5,  platelets 358, neutrophils 6.7, lymphocytes 2.7, monocytes 0.8.  MCV  81.5.  PT 13.9, PTT 35, INR 1.1.  Sodium 133, potassium 5.0, BUN 10,  creatinine 1.3, glucose 78, total bilirubin 1.0, alkaline phosphatase  77.  AST 34, ALT 20, total protein 76, albumin 3.9, calcium 9.5, lipase  16.   ASSESSMENT:  1. Right upper lung mass, status post needle biopsy on March 29, 2007.      Pathology pending.  2. Remote history of Wilms' tumor treated with left nephrectomy, right      lung resection and chemotherapy and radiation.  3. Right lower chest-supracostal discomfort of unknown etiology.  4. Fever, fatigue, arthralgias, myalgias, questionable infection      versus related to a possible recurrent tumor.   RECOMMENDATIONS:  Dr. Truett Perna has seen and evaluated the patient, and  the chart has been reviewed.  1. Follow up final pathology.  2. Agree with additional staging evaluation.  He may benefit from an      outpatient PET scan.  3. Follow up reports from treatment of Wilms' tumor.  4. Outpatient follow up at the cancer center.  Refer to [facility] pediatric      hematology oncology if Wilms' tumor is confirmed by pathology      report.  The right chest mass may be related to recurrent Wilms'      tumor, lung cancer  or a benign tumor.  We will follow up on the      final pathology and recommend treatment.  The right subcostal      discomfort may be  related to the right chest mass or another cause.      He will need additional staging, perhaps a PET scan versus CT of      the abdomen and pelvis would be adequate.  5. If Wilms' tumor is confirmed, the surgical resection would be the      likely initial treatment choice.  Dr. Truett Perna will consult the      pediatric hematology/oncology service at [facility].   Thank you very much for allowing [place] the opportunity to participate in  the care of [name].      Marlowe Kays, Kemper Durie B. Truett Perna, M.D.  Electronically Signed    SW/MEDQ  D:  03/30/2007  T:  03/30/2007  Job:  [id number]   cc:   Lucita Ferrara, MD

## 2011-02-11 NOTE — Letter (Signed)
April 14, 2007   Leighton Roach. Truett Perna, M.D.  [address]  [place] [address]   Re:  [name]               DOB:   Dear Nida Boatman,   I appreciate the opportunity to see this patient, a 30 year old African  American male who has had a really interesting history in his short  life. Apparently at age 38 he had a left nephrectomy and a right  thoracotomy for Wilms tumor. He went to the short care and got a chest x-  ray and then a CT scan because of right flank pain, and at that time he  was found to have a right apical mass that was 6 cm lesion, it is  posterior in size. There was no bony destruction. 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  He is  referred here for resection. Other than right flank pain, he has no  other problems. He had a recent URI that is resolved.   PAST MEDICAL HISTORY:  Negative except for above. He is on no  medications except Tylenol.   SOCIAL HISTORY:  Married, smokes 10 to 15 cigarettes a day, does not  drink alcohol.   FAMILY HISTORY:  Negative for cancer and vascular disease. He has 4  children.   REVIEW OF SYSTEMS:  Weight has been stable. CARDIAC: No angina or atrial  fibrillation. PULMONARY: No hemoptysis or recent upper respiratory  infection. He says his brothers and sisters do have asthma. GI: No  nausea, vomiting, constipation, diarrhea, GERD, GU. See history of  present illness. He has a hypertrophic right kidney. VASCULAR: No  claudication, DVT, TIAs. NEUROLOGICAL: No headaches, blackouts,  seizures, or dizziness. MUSCULOSKELETAL: No joint pain or rash.  PSYCHIATRIC: No depression or nervousness. ENT:  No changes in eyesight  or hearing. HEMATOLOGIC: No problems with bleeding or clotting  disorders.   PHYSICAL EXAMINATION:  He is a well-developed [place] American male, no  acute distress. His blood pressure is 115/74, pulse 86, respirations 18,  sats were 98%. Head, eyes, ears, nose, and  throat are unremarkable. Neck  is supple without thyromegaly. There is no supraclavicular adenopathy.  Chest is clear to auscultation and percussion. There is a right  thoracotomy scar and a chest tube drainage site. Abdomen is soft. There  is no palpable masses. Bowel sounds are normal. There are surgical  scars. Extremity pulses are 2+. There is no clubbing or edema.  Neurologically he is oriented x3. Sensory and motor intact. Cranial  nerves intact.   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 Hopefully it  can be resected using a posterior thoracotomy approach. We will have to  use a Paulson approach to get up to the tumor. Before I do this though,  I want to get an MRI just to be sure that there is no neuroforaminal  involvement. I have tentatively scheduled him for August 11 and we will  see him back in the office August 5 for final discussion of the surgery.   [name], M.D.  Electronically Signed   DPB/MEDQ  D:  04/14/2007  T:  04/15/2007  Job:  [id number]   cc:   Lucita Ferrara, MD

## 2011-02-11 NOTE — Assessment & Plan Note (Signed)
OFFICE VISIT   [name], ROTHE  DOB:  10-13-1980                                        December 19, 2008  CHART #:  [id number]   HISTORY OF PRESENT ILLNESS:  The patient is status post right  thoracotomy with resection of schwannoma on May 10, 2007, done by Dr.  Edwyna Shell.  The patient was last seen in the office on June 21, 2008.  He presents today for his 71-month followup visit with a chest x-ray.  The patient still complains of numbness in his right arm as well as some  weakness after repetitive motion.  Otherwise, the patient is without  complaints.  Denies any significant pain or shortness of breath.   PHYSICAL EXAMINATION:  VITAL SIGNS:  Blood pressure 128/81, pulse 60,  respirations 16, and O2 sats 98% on room air.  RESPIRATORY:  Clear to auscultation bilaterally.  CARDIAC:  Regular rate and rhythm.   STUDIES:  The patient had PA and lateral chest x-ray done today showing  stable postsurgical changes in the right hemithorax without acute  superimposed process.  No acute findings noted.   IMPRESSION AND PLAN:  The patient continues to progress well.  We will  plan to have him come back to see Dr. Edwyna Shell in 6 months with a repeat  chest x-ray.  The patient is told in the interim if he has any surgical  issues, he is to contact us.   [name], M.D.  Electronically Signed   KMD/MEDQ  D:  12/19/2008  T:  12/20/2008  Job:  [id number]

## 2011-02-11 NOTE — Assessment & Plan Note (Signed)
OFFICE VISIT   [name], [name]  DOB:  1981-01-04                                        July 28, 2007  CHART #:  [id number]   Patient came for followup, now 2-1/2 months since his surgery.  He is  still having some moderate pain.  He says he can now pick up 50 pounds,  so we will release him to return to work on August 14, 2007.   PHYSICAL EXAMINATION:  Blood pressure is 115/73, pulse 69, respirations  18.  Sats were 98%.  Lungs:  Clear to auscultation and percussion.   Chest x-ray showed normal postoperative changes.   He is doing well overall, but he is still requiring some pain  medications, so I told him we would refill his pain medication for the  next two months.  He takes Lortab 7.5/500.   [name], M.D.  Electronically Signed   DPB/MEDQ  D:  07/28/2007  T:  07/28/2007  Job:  [id number]

## 2011-02-11 NOTE — Letter (Signed)
May 05, 2007   Leighton Roach. Truett Perna, M.D.  [address]  [place], [place] [address]   Re:  AMROM, ORE              DOB:  1981/07/02   Dear Nida Boatman:   I saw Mr. Tullier in the office today, and we have scheduled surgery for  August 11, and we will do a right thoracotomy and resection of the  Schwannoma. Since he had previous surgery, this will take about three or  four hours to do the operation. Have to do a modification of a superior  sulcus tumor incision. Overall, though, he is doing well. His MRI showed  no impingements on blood vessels or the spinal cord. I appreciate  continuing to see Dr. Jennette Kettle.   [name], M.D.  Electronically Signed   DPB/MEDQ  D:  05/05/2007  T:  05/06/2007  Job:  [id number]

## 2011-07-11 LAB — CBC
HCT: 25.6 — ABNORMAL LOW
Hemoglobin: 8.6 — ABNORMAL LOW
Hemoglobin: 9.6 — ABNORMAL LOW
MCHC: 33.6
MCHC: 33.7
MCV: 80.6
Platelets: 418 — ABNORMAL HIGH
RBC: 3.17 — ABNORMAL LOW
RDW: 15.3 — ABNORMAL HIGH

## 2011-07-11 LAB — BASIC METABOLIC PANEL
BUN: 10
CO2: 29
CO2: 31
Calcium: 9.1
Chloride: 92 — ABNORMAL LOW
Creatinine, Ser: 0.88
GFR calc Af Amer: >60
Glucose, Bld: 143 — ABNORMAL HIGH
Sodium: 129 — ABNORMAL LOW

## 2011-07-14 LAB — CBC
HCT: 30.6 — ABNORMAL LOW
HCT: 42.2
MCHC: 32.6
MCHC: 33
MCV: 81.3
Platelets: 220
Platelets: 235
Platelets: 341
RBC: 3.63 — ABNORMAL LOW
RDW: 14.8 — ABNORMAL HIGH
RDW: 15.2 — ABNORMAL HIGH
RDW: 15.3 — ABNORMAL HIGH

## 2011-07-14 LAB — PROTIME-INR: INR: 1

## 2011-07-14 LAB — TYPE AND SCREEN
ABO/RH(D): O POS
Antibody Screen: NEGATIVE

## 2011-07-14 LAB — COMPREHENSIVE METABOLIC PANEL
ALT: 13
AST: 27
Albumin: 2.5 — ABNORMAL LOW
Albumin: 3.9
Alkaline Phosphatase: 59
BUN: 7
CO2: 31
Calcium: 8.2 — ABNORMAL LOW
Creatinine, Ser: 0.92
Creatinine, Ser: 1
GFR calc Af Amer: >60
Potassium: 4.6
Sodium: 129 — ABNORMAL LOW
Total Protein: 5.2 — ABNORMAL LOW
Total Protein: 6.9

## 2011-07-14 LAB — URINALYSIS, ROUTINE W REFLEX MICROSCOPIC
Ketones, ur: NEGATIVE
Nitrite: NEGATIVE
Protein, ur: NEGATIVE

## 2011-07-14 LAB — BASIC METABOLIC PANEL
BUN: 6
BUN: 8
CO2: 27
CO2: 31
Calcium: 8 — ABNORMAL LOW
Chloride: 93 — ABNORMAL LOW
Creatinine, Ser: 0.93
GFR calc non Af Amer: >60
Glucose, Bld: 123 — ABNORMAL HIGH
Glucose, Bld: 137 — ABNORMAL HIGH
Potassium: 4.2
Potassium: 4.4

## 2011-07-14 LAB — BLOOD GAS, ARTERIAL
Acid-Base Excess: 1.6
FIO2: 0.21
Patient temperature: 98.4
Patient temperature: 98.6
TCO2: 26.4
pCO2 arterial: 47.3 — ABNORMAL HIGH
pH, Arterial: 7.388

## 2011-07-14 LAB — APTT: aPTT: 30

## 2011-07-16 LAB — I-STAT 8, (EC8 V) (CONVERTED LAB)
BUN: 10
Chloride: 103
HCT: 49
Hemoglobin: 16.7
Operator id: 263631
Sodium: 133 — ABNORMAL LOW
pCO2, Ven: 40.5 — ABNORMAL LOW

## 2011-07-16 LAB — POCT URINALYSIS DIP (DEVICE)
Hgb urine dipstick: NEGATIVE
Ketones, ur: NEGATIVE
Operator id: 235561
Protein, ur: 30 — AB
Specific Gravity, Urine: 1.02
Urobilinogen, UA: 2 — ABNORMAL HIGH

## 2011-07-16 LAB — COMPREHENSIVE METABOLIC PANEL
ALT: 20
Calcium: 9.5
GFR calc Af Amer: >60
Glucose, Bld: 76
Sodium: 135
Total Protein: 7.6

## 2011-07-16 LAB — CBC
HCT: 43.5
MCHC: 32.3
MCV: 81.5
Platelets: 358
WBC: 10.5

## 2011-07-16 LAB — POCT I-STAT CREATININE
Creatinine, Ser: 1.3
Operator id: 263631

## 2011-07-16 LAB — DIFFERENTIAL
Eosinophils Absolute: 0.1
Lymphs Abs: 2.7
Monocytes Relative: 8
Neutrophils Relative %: 64

## 2011-11-20 ENCOUNTER — Other Ambulatory Visit: Payer: Self-pay | Admitting: Thoracic Surgery

## 2011-11-20 DIAGNOSIS — S271XXA Traumatic hemothorax, initial encounter: Secondary | ICD-10-CM

## 2011-11-24 ENCOUNTER — Other Ambulatory Visit: Payer: Self-pay | Admitting: Thoracic Surgery

## 2011-11-26 ENCOUNTER — Ambulatory Visit (INDEPENDENT_AMBULATORY_CARE_PROVIDER_SITE_OTHER): Payer: 59 | Admitting: Thoracic Surgery

## 2011-11-26 ENCOUNTER — Encounter: Payer: Self-pay | Admitting: Thoracic Surgery

## 2011-11-26 ENCOUNTER — Ambulatory Visit
Admission: RE | Admit: 2011-11-26 | Discharge: 2011-11-26 | Disposition: A | Discharge status: Home / Self Care | Payer: 59 | Source: Ambulatory Visit | Attending: Thoracic Surgery | Admitting: Thoracic Surgery

## 2011-11-26 VITALS — BP 120/75 | HR 60 | Resp 16 | Ht 65.0 in

## 2011-11-26 DIAGNOSIS — D213 Benign neoplasm of connective and other soft tissue of thorax: Secondary | ICD-10-CM

## 2011-11-26 DIAGNOSIS — Z09 Encounter for follow-up examination after completed treatment for conditions other than malignant neoplasm: Secondary | ICD-10-CM

## 2011-11-26 DIAGNOSIS — S271XXA Traumatic hemothorax, initial encounter: Secondary | ICD-10-CM

## 2011-11-26 NOTE — Progress Notes (Signed)
HPI patient was referred for followup. 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 Chest x-ray shows no evidence of recurrence of the mass. His incision is well healed he is doing well overall. Schwanoma have a low tendency for recurrence. I recommended he get a chest x-ray every two years. No further followup as needed from my standpoint Current Outpatient Prescriptions  Medication Sig Dispense Refill  . atorvastatin (LIPITOR) 10 MG tablet Take 10 mg by mouth daily.      Marland Kitchen lisinopril (PRINIVIL,ZESTRIL) 10 MG tablet Take 10 mg by mouth daily.      . metFORMIN (GLUCOPHAGE) 500 MG tablet Take 500 mg by mouth once.         Review of Systems: No change   Physical Exam lungs are clear attestation percussion   Diagnostic Tests: Chest x-ray shows normal postoperative changes in the right apex   Impression: 5 years post excision of the schwannoma right apex   Plan: Return as needed

## 2013-05-18 ENCOUNTER — Encounter (HOSPITAL_COMMUNITY): Payer: Self-pay

## 2013-05-18 ENCOUNTER — Encounter (HOSPITAL_COMMUNITY): Payer: Self-pay | Admitting: Emergency Medicine

## 2013-05-18 ENCOUNTER — Emergency Department (INDEPENDENT_AMBULATORY_CARE_PROVIDER_SITE_OTHER): Payer: 59

## 2013-05-18 ENCOUNTER — Emergency Department (HOSPITAL_COMMUNITY)
Admission: EM | Admit: 2013-05-18 | Discharge: 2013-05-18 | Disposition: A | Discharge status: Home / Self Care | Payer: 59 | Attending: Emergency Medicine | Admitting: Emergency Medicine

## 2013-05-18 ENCOUNTER — Emergency Department (HOSPITAL_COMMUNITY)
Admission: EM | Admit: 2013-05-18 | Discharge: 2013-05-18 | Disposition: A | Discharge status: Other Acute Inpatient Hospital | Payer: 59 | Source: Home / Self Care | Attending: Family Medicine | Admitting: Family Medicine

## 2013-05-18 DIAGNOSIS — R0609 Other forms of dyspnea: Secondary | ICD-10-CM

## 2013-05-18 DIAGNOSIS — R071 Chest pain on breathing: Secondary | ICD-10-CM | POA: Insufficient documentation

## 2013-05-18 DIAGNOSIS — F17200 Nicotine dependence, unspecified, uncomplicated: Secondary | ICD-10-CM | POA: Insufficient documentation

## 2013-05-18 DIAGNOSIS — Z79899 Other long term (current) drug therapy: Secondary | ICD-10-CM | POA: Insufficient documentation

## 2013-05-18 DIAGNOSIS — R079 Chest pain, unspecified: Secondary | ICD-10-CM

## 2013-05-18 LAB — CBC
HCT: 42 % (ref 39.0–52.0)
Hemoglobin: 14.5 g/dL (ref 13.0–17.0)
MCH: 27.7 pg (ref 26.0–34.0)
MCHC: 34.5 g/dL (ref 30.0–36.0)
MCV: 80.3 fL (ref 78.0–100.0)
Platelets: 227 K/uL (ref 150–400)
RBC: 5.23 MIL/uL (ref 4.22–5.81)
RDW: 14.1 % (ref 11.5–15.5)
WBC: 7.8 K/uL (ref 4.0–10.5)

## 2013-05-18 LAB — BASIC METABOLIC PANEL
CO2: 23 mEq/L (ref 19–32)
Calcium: 9.8 mg/dL (ref 8.4–10.5)
Creatinine, Ser: 1.03 mg/dL (ref 0.50–1.35)
Glucose, Bld: 77 mg/dL (ref 70–99)

## 2013-05-18 LAB — PRO B NATRIURETIC PEPTIDE: Pro B Natriuretic peptide (BNP): 21.2 pg/mL (ref 0–125)

## 2013-05-18 LAB — D-DIMER, QUANTITATIVE: D-Dimer, Quant: <0.27 ug/mL-FEU (ref 0.00–0.48)

## 2013-05-18 MED ORDER — NAPROXEN 500 MG PO TABS: 500.0000 mg | ORAL_TABLET | Freq: Two times a day (BID) | ORAL | Status: DC
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
## 2013-05-18 NOTE — ED Notes (Signed)
PT. TRANSFERRED FROM [facility] REPORTS RIGHT CHEST PAIN WITH DEEP INSPIRATION /LAUGHING AND CERTAIN POSITIONS , CHEST X-RAY DONE AT URGENT CARE .

## 2013-05-18 NOTE — ED Notes (Signed)
MD Miller at bedside. 

## 2013-05-18 NOTE — ED Notes (Signed)
No changes, pt alert, NAD, calm, interactive, family at [facility], pt updated.

## 2013-05-18 NOTE — ED Notes (Signed)
EDP at [facility] for US guided IV.

## 2013-05-18 NOTE — ED Provider Notes (Signed)
CSN: [id number]     Arrival date & time 05/18/13  1910 History     First MD Initiated Contact with Patient 05/18/13 2004     Chief Complaint  Patient presents with  . Chest Pain   (Consider location/radiation/quality/duration/timing/severity/associated sxs/prior Treatment) HPI Comments: Pt is a 32 y/o male with hx of Renal cell CA, lung mass s/p resection, has presented this evening with a complaint of right-sided chest pain which has been present for almost 20 hours. This is worse with deep breathing, goes away at rest. He has no history of pneumothorax nor does have a history of pulmonary intervals. The symptoms are intermittent, mild, not associated with fevers chills or coughing. No swelling in the legs, no recent injuries, no recent surgeries or recent immobilizations. He does smoke cigarettes  Patient is a 32 y.o. male presenting with chest pain. The history is provided by the patient.  Chest Pain   History reviewed. No pertinent past medical history. History reviewed. No pertinent past surgical history. No family history on file. History  Substance Use Topics  . Smoking status: Current Every Day Smoker  . Smokeless tobacco: Never Used  . Alcohol Use: Yes    Review of Systems  Cardiovascular: Positive for chest pain.  All other systems reviewed and are negative.    Allergies  Review of patient's allergies indicates no known allergies.  Home Medications   Current Outpatient Rx  Name  Route  Sig  Dispense  Refill  . Aspirin-Caffeine 845-65 MG PACK   Oral   Take 1 Package by mouth every 6 (six) weeks.         Marland Kitchen atorvastatin (LIPITOR) 10 MG tablet   Oral   Take 10 mg by mouth daily.         Marland Kitchen escitalopram (LEXAPRO) 20 MG tablet   Oral   Take 20 mg by mouth daily.         Marland Kitchen lisinopril (PRINIVIL,ZESTRIL) 2.5 MG tablet   Oral   Take 2.5 mg by mouth daily.         . pantoprazole (PROTONIX) 40 MG tablet   Oral   Take 40 mg by mouth daily.         .  naproxen (NAPROSYN) 500 MG tablet   Oral   Take 1 tablet (500 mg total) by mouth 2 (two) times daily with a meal.   30 tablet   0    BP 133/75  Pulse 51  Temp(Src) 98.3 F (36.8 C) (Oral)  Resp 23  Ht 5\' 5"  (1.651 m)  Wt 147 lb 9 oz (66.934 kg)  BMI 24.56 kg/m2  SpO2 100% Physical Exam  Nursing note and vitals reviewed. Constitutional: He appears well-developed and well-nourished. No distress.  HENT:  Head: Normocephalic and atraumatic.  Mouth/Throat: Oropharynx is clear and moist. No oropharyngeal exudate.  Eyes: Conjunctivae and EOM are normal. Pupils are equal, round, and reactive to light. Right eye exhibits no discharge. Left eye exhibits no discharge. No scleral icterus.  Neck: Normal range of motion. Neck supple. No JVD present. No thyromegaly present.  Cardiovascular: Normal rate, regular rhythm, normal heart sounds and intact distal pulses.  Exam reveals no gallop and no friction rub.   No murmur heard. Pulmonary/Chest: Effort normal and breath sounds normal. No respiratory distress. He has no wheezes. He has no rales.  Abdominal: Soft. Bowel sounds are normal. He exhibits no distension and no mass. There is no tenderness.  Musculoskeletal: Normal range of motion. He  exhibits no edema and no tenderness.  Lymphadenopathy:    He has no cervical adenopathy.  Neurological: He is alert. Coordination normal.  Skin: Skin is warm and dry. No rash noted. No erythema.  Psychiatric: He has a normal mood and affect. His behavior is normal.    ED Course   Procedures (including critical care time)  Labs Reviewed  BASIC METABOLIC PANEL - Abnormal; Notable for the following:    Sodium 133 (*)    All other components within normal limits  CBC  PRO B NATRIURETIC PEPTIDE  D-DIMER, QUANTITATIVE  POCT I-STAT TROPONIN I   Dg Chest 2 View  05/18/2013   *RADIOLOGY REPORT*  Clinical Data: Pleuritic right-sided chest pain.  CHEST - 2 VIEW  Comparison: 11/26/2011  Findings: The lungs  are clear without focal infiltrate, edema, pneumothorax or pleural effusion. Suture line noted in the right mid lung peripherally.  Cardiopericardial silhouette is at upper limits of normal for size. Imaged bony structures of the thorax are intact.  IMPRESSION: No acute cardiopulmonary findings.   Original Report Authenticated By: Kennith Center, M.D.   1. Chest pain     MDM  On exam the patient has no tachycardia, no hypoxia, no abnormal lung sounds. His vital signs are unremarkable. Hemoglobin workup including a chest x-ray and a d-dimer. Possible CT if d-dimer high and no pneumothorax. A 0/10 pain at rest. Declines pain medicines at this time.  ED ECG REPORT  I personally interpreted this EKG   Date: 05/18/2013   Rate: 53  Rhythm: SB  QRS Axis: normal  Intervals: normal  ST/T Wave abnormalities: nonspecific ST/T changes  Conduction Disutrbances:none  Narrative Interpretation:   Old EKG Reviewed: none available  I have seen and interpreted the x-ray and find her to be no signs of pneumothorax masses pulmonary edema or pulmonary infiltrates. The EKG is nonischemic and the lab work is normal including a d-dimer. The patient appears stable for discharge  Meds given in ED:  Medications - No data to display  New Prescriptions   NAPROXEN (NAPROSYN) 500 MG TABLET    Take 1 tablet (500 mg total) by mouth 2 (two) times daily with a meal.      Vida Roller, MD 05/18/13 [phone]

## 2013-05-18 NOTE — ED Notes (Signed)
Pt dc'd home w/all belongings, pt alert and ambulatory upon dc, 1 new rx prescribed, pt verbalizes understanding of dc instructions, pt driven home by family

## 2013-05-18 NOTE — ED Notes (Signed)
Xray cancelled d/t recent CXR tonight PTA. Pt sitting in chair visiting with family, no coughing, no s/sx dyspnea, speaking in cler complete sentences, skin W&D, resps e/u, cap refill <2sec.

## 2013-05-18 NOTE — ED Notes (Signed)
History of tumor (non-cancerous) right chest, nephrectomy left side; states he woke this AM w pain in right chest similar to pain w tumor; c/o SOB w exertion

## 2013-05-18 NOTE — ED Notes (Signed)
Lab at [facility], med rec tech in doorway. Pt alert, NAD, calm, interactive, VSS, pt talking on phone.

## 2015-02-14 ENCOUNTER — Emergency Department (INDEPENDENT_AMBULATORY_CARE_PROVIDER_SITE_OTHER): Payer: Self-pay

## 2015-02-14 ENCOUNTER — Encounter (HOSPITAL_COMMUNITY): Payer: Self-pay | Admitting: Emergency Medicine

## 2015-02-14 ENCOUNTER — Emergency Department (INDEPENDENT_AMBULATORY_CARE_PROVIDER_SITE_OTHER)
Admission: EM | Admit: 2015-02-14 | Discharge: 2015-02-14 | Disposition: A | Discharge status: Home / Self Care | Payer: Self-pay | Source: Home / Self Care | Attending: Family Medicine | Admitting: Family Medicine

## 2015-02-14 DIAGNOSIS — M533 Sacrococcygeal disorders, not elsewhere classified: Secondary | ICD-10-CM

## 2015-02-14 MED ORDER — DICLOFENAC SODIUM 75 MG PO TBEC: 75.0000 mg | DELAYED_RELEASE_TABLET | Freq: Two times a day (BID) | ORAL | Status: DC | PRN

## 2015-02-14 NOTE — Discharge Instructions (Signed)
Thank you for coming in today. Take diclofenac for pain as needed. Come back if worsening.   Tailbone Injury The tailbone (coccyx) is the small bone at the lower end of the spine. A tailbone injury may involve stretched ligaments, bruising, or a broken bone (fracture). Women are more vulnerable to this injury due to having a wider pelvis. CAUSES  This type of injury typically occurs from falling and landing on the tailbone. Repeated strain or friction from actions such as rowing and bicycling may also injure the area. The tailbone can be injured during childbirth. Infections or tumors may also press on the tailbone and cause pain. Sometimes, the cause of injury is unknown. SYMPTOMS   Bruising.  Pain when sitting.  Painful bowel movements.  In women, pain during intercourse. DIAGNOSIS  Your caregiver can diagnose a tailbone injury based on your symptoms and a physical exam. X-rays may be taken if a fracture is suspected. Your caregiver may also use an MRI scan imaging test to evaluate your symptoms. TREATMENT  Your caregiver may prescribe medicines to help relieve your pain. Most tailbone injuries heal on their own in 4 to 6 weeks. However, if the injury is caused by an infection or tumor, the recovery period may vary. PREVENTION  Wear appropriate padding and sports gear when bicycling and rowing. This can help prevent an injury from repeated strain or friction. HOME CARE INSTRUCTIONS   Put ice on the injured area.  Put ice in a plastic bag.  Place a towel between your skin and the bag.  Leave the ice on for 15-20 minutes, every hour while awake for the first 1 to 2 days.  Sit on a large, rubber or inflated ring or cushion to ease your pain. Lean forward when sitting to help decrease discomfort.  Avoid sitting for long periods of time.  Increase your activity as the pain allows.  Only take over-the-counter or prescription medicines for pain, discomfort, or fever as directed by  your caregiver.  You may use stool softeners if it is painful to have a bowel movement, or as directed by your caregiver.  Eat a diet with plenty of fiber to help prevent constipation.  Keep all follow-up appointments as directed by your caregiver. SEEK MEDICAL CARE IF:   Your pain becomes worse.  Your bowel movements cause a great deal of discomfort.  You are unable to have a bowel movement.  You have a fever. MAKE SURE YOU:  Understand these instructions.  Will watch your condition.  Will get help right away if you are not doing well or get worse. Document Released: 09/12/2000 Document Revised: 12/08/2011 Document Reviewed: 04/10/2011 [facility] Patient Information 2015 [place], [place]. This information is not intended to replace advice given to you by your health care provider. Make sure you discuss any questions you have with your health care provider.

## 2015-02-14 NOTE — ED Provider Notes (Signed)
Kaylon Hitz is a 34 y.o. male who presents to Urgent Care today for coccyx pain. Patient developed pain around his coccyx 3 days ago. Pain occurred without injury. No fevers or chills nausea vomiting or diarrhea. Pain is worse with activity and sitting. He has tried Tylenol which has not helped much.   History reviewed. No pertinent past medical history. History reviewed. No pertinent past surgical history. History  Substance Use Topics  . Smoking status: Current Every Day Smoker  . Smokeless tobacco: Never Used  . Alcohol Use: Yes   ROS as above Medications: No current facility-administered medications for this encounter.   Current Outpatient Prescriptions  Medication Sig Dispense Refill  . atorvastatin (LIPITOR) 10 MG tablet Take 10 mg by mouth daily.    Marland Kitchen escitalopram (LEXAPRO) 20 MG tablet Take 20 mg by mouth daily.    Marland Kitchen lisinopril (PRINIVIL,ZESTRIL) 2.5 MG tablet Take 2.5 mg by mouth daily.    . pantoprazole (PROTONIX) 40 MG tablet Take 40 mg by mouth daily.    . Aspirin-Caffeine 845-65 MG PACK Take 1 Package by mouth every 6 (six) weeks.    . diclofenac (VOLTAREN) 75 MG EC tablet Take 1 tablet (75 mg total) by mouth 2 (two) times daily as needed. 30 tablet 0   No Known Allergies   Exam:  BP 146/51 mmHg  Pulse 69  Temp(Src) 98.6 F (37 C) (Oral)  Resp 20  SpO2 100% Gen: Well NAD HEENT: EOMI,  MMM Lungs: Normal work of breathing. CTABL Heart: RRR no MRG Abd: NABS, Soft. Nondistended, Nontender Exts: Brisk capillary refill, warm and well perfused.  Skin: No erythema or induration around the coccyx. Spine: Tender coccyx  No results found for this or any previous visit (from the past 24 hour(s)). Dg Sacrum/coccyx  02/14/2015   CLINICAL DATA:  Coccygeal pain since 02/12/2015.  No known injury.  EXAM: SACRUM AND COCCYX - 2+ VIEW  COMPARISON:  CT abdomen and pelvis 04/10/2007.  FINDINGS: There is no evidence of fracture or other focal bone lesions.  IMPRESSION: Negative.    Electronically Signed   By: Inge Rise M.D.   On: 02/14/2015 16:09    Assessment and Plan: 34 y.o. male with coccydynia. No evidence of pilonidal cyst abscess. Treatment with diclofenac watchful waiting follow-up as needed.  Discussed warning signs or symptoms. Please see discharge instructions. Patient expresses understanding.     Gregor Hams, MD 02/14/15 [phone]

## 2015-02-14 NOTE — ED Notes (Signed)
Reports pain around coccyx onset Monday night, 5/16; denies inj/trauma Pain increases w/activity Denies urinary sx  Alert, no signs of acute distress.

## 2015-05-05 ENCOUNTER — Emergency Department (HOSPITAL_COMMUNITY)
Admission: EM | Admit: 2015-05-05 | Discharge: 2015-05-05 | Disposition: A | Discharge status: Home / Self Care | Payer: Self-pay | Attending: Emergency Medicine | Admitting: Emergency Medicine

## 2015-05-05 ENCOUNTER — Encounter (HOSPITAL_COMMUNITY): Payer: Self-pay | Admitting: Family Medicine

## 2015-05-05 DIAGNOSIS — Z720 Tobacco use: Secondary | ICD-10-CM | POA: Insufficient documentation

## 2015-05-05 DIAGNOSIS — Z79899 Other long term (current) drug therapy: Secondary | ICD-10-CM | POA: Insufficient documentation

## 2015-05-05 DIAGNOSIS — R0981 Nasal congestion: Secondary | ICD-10-CM | POA: Insufficient documentation

## 2015-05-05 LAB — CBG MONITORING, ED: GLUCOSE-CAPILLARY: 84 mg/dL (ref 65–99)

## 2015-05-05 NOTE — ED Notes (Signed)
Pt sts pressure in nasal area. sts x 3 days. Denies any congestion. Denies mucous. sts he doesn't feel well.

## 2015-05-05 NOTE — Discharge Instructions (Signed)
Upper Respiratory Infection, Adult An upper respiratory infection (URI) is also sometimes known as the common cold. The upper respiratory tract includes the nose, sinuses, throat, trachea, and bronchi. Bronchi are the airways leading to the lungs. Most people improve within 1 week, but symptoms can last up to 2 weeks. A residual cough may last even longer.  CAUSES Many different viruses can infect the tissues lining the upper respiratory tract. The tissues become irritated and inflamed and often become very moist. Mucus production is also common. A cold is contagious. You can easily spread the virus to others by oral contact. This includes kissing, sharing a glass, coughing, or sneezing. Touching your mouth or nose and then touching a surface, which is then touched by another person, can also spread the virus. SYMPTOMS  Symptoms typically develop 1 to 3 days after you come in contact with a cold virus. Symptoms vary from person to person. They may include:  Runny nose.  Sneezing.  Nasal congestion.  Sinus irritation.  Sore throat.  Loss of voice (laryngitis).  Cough.  Fatigue.  Muscle aches.  Loss of appetite.  Headache.  Low-grade fever. DIAGNOSIS  You might diagnose your own cold based on familiar symptoms, since most people get a cold 2 to 3 times a year. Your caregiver can confirm this based on your exam. Most importantly, your caregiver can check that your symptoms are not due to another disease such as strep throat, sinusitis, pneumonia, asthma, or epiglottitis. Blood tests, throat tests, and X-rays are not necessary to diagnose a common cold, but they may sometimes be helpful in excluding other more serious diseases. Your caregiver will decide if any further tests are required. RISKS AND COMPLICATIONS  You may be at risk for a more severe case of the common cold if you smoke cigarettes, have chronic heart disease (such as heart failure) or lung disease (such as asthma), or if  you have a weakened immune system. The very young and very old are also at risk for more serious infections. Bacterial sinusitis, middle ear infections, and bacterial pneumonia can complicate the common cold. The common cold can worsen asthma and chronic obstructive pulmonary disease (COPD). Sometimes, these complications can require emergency medical care and may be life-threatening. PREVENTION  The best way to protect against getting a cold is to practice good hygiene. Avoid oral or hand contact with people with cold symptoms. Wash your hands often if contact occurs. There is no clear evidence that vitamin C, vitamin E, echinacea, or exercise reduces the chance of developing a cold. However, it is always recommended to get plenty of rest and practice good nutrition. TREATMENT  Treatment is directed at relieving symptoms. There is no cure. Antibiotics are not effective, because the infection is caused by a virus, not by bacteria. Treatment may include:  Increased fluid intake. Sports drinks offer valuable electrolytes, sugars, and fluids.  Breathing heated mist or steam (vaporizer or shower).  Eating chicken soup or other clear broths, and maintaining good nutrition.  Getting plenty of rest.  Using gargles or lozenges for comfort.  Controlling fevers with ibuprofen or acetaminophen as directed by your caregiver.  Increasing usage of your inhaler if you have asthma. Zinc gel and zinc lozenges, taken in the first 24 hours of the common cold, can shorten the duration and lessen the severity of symptoms. Pain medicines may help with fever, muscle aches, and throat pain. A variety of non-prescription medicines are available to treat congestion and runny nose. Your caregiver   can make recommendations and may suggest nasal or lung inhalers for other symptoms.  HOME CARE INSTRUCTIONS   Only take over-the-counter or prescription medicines for pain, discomfort, or fever as directed by your  caregiver.  Use a warm mist humidifier or inhale steam from a shower to increase air moisture. This may keep secretions moist and make it easier to breathe.  Drink enough water and fluids to keep your urine clear or pale yellow.  Rest as needed.  Return to work when your temperature has returned to normal or as your caregiver advises. You may need to stay home longer to avoid infecting others. You can also use a face mask and careful hand washing to prevent spread of the virus. SEEK MEDICAL CARE IF:   After the first few days, you feel you are getting worse rather than better.  You need your caregiver's advice about medicines to control symptoms.  You develop chills, worsening shortness of breath, or brown or red sputum. These may be signs of pneumonia.  You develop yellow or brown nasal discharge or pain in the face, especially when you bend forward. These may be signs of sinusitis.  You develop a fever, swollen neck glands, pain with swallowing, or white areas in the back of your throat. These may be signs of strep throat. SEEK IMMEDIATE MEDICAL CARE IF:   You have a fever.  You develop severe or persistent headache, ear pain, sinus pain, or chest pain.  You develop wheezing, a prolonged cough, cough up blood, or have a change in your usual mucus (if you have chronic lung disease).  You develop sore muscles or a stiff neck. Document Released: 03/11/2001 Document Revised: 12/08/2011 Document Reviewed: 12/21/2013 [employer] Patient Information 2015 [employer]. This information is not intended to replace advice given to you by your health care provider. Make sure you discuss any questions you have with your health care provider.  

## 2015-05-05 NOTE — ED Notes (Signed)
Declined W/C at D/C and was escorted to lobby by RN. 

## 2015-05-05 NOTE — ED Provider Notes (Signed)
CSN: [id number]     Arrival date & time 05/05/15  1339 History  This chart was scribed for Montine Circle, PA-C, working with Jola Schmidt, MD by Steva Colder, ED Scribe. The patient was seen in room TR11C/TR11C at 3:11 PM.    Chief Complaint  Patient presents with  . Facial Pain      The history is provided by the patient. No language interpreter was used.    HPI Comments: [name] is a 34 y.o. male who presents to the Emergency Department for a check on his diabetes as he has not had his medications in several years. Pt wanted to have his A1C checked today as well. Pt denies having a PCP and no insurance. Pt notes that he has been fatigued as of lately and that is why he wanted his symptoms checked. Pt notes that he has had associated symptoms of mild HA with sinus pressure. He denies congestion, fever, and any other symptoms. Pt has a medical hx of HTN and cholesterol issues which he used to take medications for.  History reviewed. No pertinent past medical history. History reviewed. No pertinent past surgical history. History reviewed. No pertinent family history. History  Substance Use Topics  . Smoking status: Current Every Day Smoker  . Smokeless tobacco: Never Used  . Alcohol Use: Yes    Review of Systems  Constitutional: Negative for fever.  HENT: Positive for sinus pressure. Negative for congestion.   Neurological: Positive for headaches (mild).      Allergies  Review of patient's allergies indicates no known allergies.  Home Medications   Prior to Admission medications   Medication Sig Start Date End Date Taking? Authorizing Provider  Aspirin-Caffeine 845-65 MG PACK Take 1 Package by mouth every 6 (six) weeks.    Historical Provider, MD  atorvastatin (LIPITOR) 10 MG tablet Take 10 mg by mouth daily.    Historical Provider, MD  diclofenac (VOLTAREN) 75 MG EC tablet Take 1 tablet (75 mg total) by mouth 2 (two) times daily as needed. 02/14/15   Gregor Hams, MD   escitalopram (LEXAPRO) 20 MG tablet Take 20 mg by mouth daily.    Historical Provider, MD  lisinopril (PRINIVIL,ZESTRIL) 2.5 MG tablet Take 2.5 mg by mouth daily.    Historical Provider, MD  pantoprazole (PROTONIX) 40 MG tablet Take 40 mg by mouth daily.    Historical Provider, MD   BP 143/93 mmHg  Temp(Src) 97.4 F (36.3 C) (Oral)  Resp 16  Ht 5\' 4"  (1.626 m)  Wt 159 lb 2 oz (72.179 kg)  BMI 27.30 kg/m2  SpO2 100% Physical Exam  Constitutional: He is oriented to person, place, and time. He appears well-developed and well-nourished. No distress.  HENT:  Head: Normocephalic and atraumatic.  Eyes: Conjunctivae and EOM are normal. Pupils are equal, round, and reactive to light. Right eye exhibits no discharge. Left eye exhibits no discharge. No scleral icterus.  Neck: Normal range of motion. Neck supple. No JVD present. No tracheal deviation present.  Cardiovascular: Normal rate, regular rhythm and normal heart sounds.  Exam reveals no gallop and no friction rub.   No murmur heard. Pulmonary/Chest: Effort normal and breath sounds normal. No respiratory distress. He has no wheezes. He has no rales. He exhibits no tenderness.  Abdominal: Soft. He exhibits no distension and no mass. There is no tenderness. There is no rebound and no guarding.  Musculoskeletal: Normal range of motion. He exhibits no edema or tenderness.  Neurological: He is alert  and oriented to person, place, and time.  Skin: Skin is warm and dry.  Psychiatric: He has a normal mood and affect. His behavior is normal. Judgment and thought content normal.  Nursing note and vitals reviewed.   ED Course  Procedures (including critical care time) DIAGNOSTIC STUDIES: Oxygen Saturation is 100% on RA, nl by my interpretation.    COORDINATION OF CARE: 3:13 PM-Discussed treatment plan which includes cbg, referral to [place] and wellness, consult to case management with pt at bedside and pt agreed to plan.   Labs  Review Labs Reviewed  CBG MONITORING, ED    Imaging Review No results found.   EKG Interpretation None      MDM   Final diagnoses:  Sinus congestion    Patient with sinus congestion, will recommend OTC treatment. Patient also requests that his CBG and A1c be checked. CBG is 84. Patient is not in any apparent distress. Will recommend outpatient follow-up. Spoke with Education officer, museum, and have given the patient resources for primary care follow-up. Patient understands and agrees the plan. He is stable and ready for discharge.  I personally performed the services described in this documentation, which was scribed in my presence. The recorded information has been reviewed and is accurate.     Montine Circle, PA-C 05/05/15 [place], MD 05/05/15 [phone]

## 2017-12-27 ENCOUNTER — Emergency Department (HOSPITAL_COMMUNITY): Payer: Self-pay

## 2017-12-27 ENCOUNTER — Other Ambulatory Visit: Payer: Self-pay

## 2017-12-27 ENCOUNTER — Encounter (HOSPITAL_COMMUNITY): Payer: Self-pay | Admitting: Emergency Medicine

## 2017-12-27 DIAGNOSIS — F17210 Nicotine dependence, cigarettes, uncomplicated: Secondary | ICD-10-CM | POA: Insufficient documentation

## 2017-12-27 DIAGNOSIS — M5136 Other intervertebral disc degeneration, lumbar region: Secondary | ICD-10-CM | POA: Insufficient documentation

## 2017-12-27 DIAGNOSIS — Z79899 Other long term (current) drug therapy: Secondary | ICD-10-CM | POA: Insufficient documentation

## 2017-12-27 DIAGNOSIS — E119 Type 2 diabetes mellitus without complications: Secondary | ICD-10-CM | POA: Insufficient documentation

## 2017-12-27 DIAGNOSIS — Z859 Personal history of malignant neoplasm, unspecified: Secondary | ICD-10-CM | POA: Insufficient documentation

## 2017-12-27 DIAGNOSIS — I10 Essential (primary) hypertension: Secondary | ICD-10-CM | POA: Insufficient documentation

## 2017-12-27 LAB — COMPREHENSIVE METABOLIC PANEL
ALBUMIN: 4.4 g/dL (ref 3.5–5.0)
ALT: 110 U/L — AB (ref 17–63)
AST: 228 U/L — AB (ref 15–41)
Alkaline Phosphatase: 85 U/L (ref 38–126)
Anion gap: 14 (ref 5–15)
BUN: 7 mg/dL (ref 6–20)
CO2: 22 mmol/L (ref 22–32)
CREATININE: 1.03 mg/dL (ref 0.61–1.24)
Calcium: 9.7 mg/dL (ref 8.9–10.3)
Chloride: 100 mmol/L — ABNORMAL LOW (ref 101–111)
GFR calc Af Amer: >60 mL/min (ref >60)
GFR calc non Af Amer: >60 mL/min (ref >60)
GLUCOSE: 142 mg/dL — AB (ref 65–99)
Potassium: 4.4 mmol/L (ref 3.5–5.1)
SODIUM: 136 mmol/L (ref 135–145)
Total Bilirubin: 1.1 mg/dL (ref 0.3–1.2)
Total Protein: 7.9 g/dL (ref 6.5–8.1)

## 2017-12-27 LAB — DIFFERENTIAL
BASOS ABS: 0 10*3/uL (ref 0.0–0.1)
Basophils Relative: 1 %
Eosinophils Absolute: 0.1 10*3/uL (ref 0.0–0.7)
Eosinophils Relative: 1 %
Lymphocytes Relative: 17 %
Lymphs Abs: 1.1 10*3/uL (ref 0.7–4.0)
Monocytes Absolute: 0.3 10*3/uL (ref 0.1–1.0)
Monocytes Relative: 5 %
NEUTROS ABS: 4.9 10*3/uL (ref 1.7–7.7)
Neutrophils Relative %: 76 %

## 2017-12-27 LAB — I-STAT CHEM 8, ED
BUN: 8 mg/dL (ref 6–20)
CREATININE: 0.9 mg/dL (ref 0.61–1.24)
Calcium, Ion: 1.09 mmol/L — ABNORMAL LOW (ref 1.15–1.40)
Chloride: 101 mmol/L (ref 101–111)
Glucose, Bld: 137 mg/dL — ABNORMAL HIGH (ref 65–99)
HEMATOCRIT: 50 % (ref 39.0–52.0)
Hemoglobin: 17 g/dL (ref 13.0–17.0)
POTASSIUM: 4.5 mmol/L (ref 3.5–5.1)
Sodium: 136 mmol/L (ref 135–145)
TCO2: 26 mmol/L (ref 22–32)

## 2017-12-27 LAB — CBC
HCT: 45.1 % (ref 39.0–52.0)
Hemoglobin: 15.4 g/dL (ref 13.0–17.0)
MCH: 29.7 pg (ref 26.0–34.0)
MCHC: 34.1 g/dL (ref 30.0–36.0)
MCV: 86.9 fL (ref 78.0–100.0)
PLATELETS: 169 10*3/uL (ref 150–400)
RBC: 5.19 MIL/uL (ref 4.22–5.81)
RDW: 13.6 % (ref 11.5–15.5)
WBC: 6.3 10*3/uL (ref 4.0–10.5)

## 2017-12-27 LAB — PROTIME-INR
INR: 0.93
PROTHROMBIN TIME: 12.4 s (ref 11.4–15.2)

## 2017-12-27 LAB — I-STAT TROPONIN, ED: Troponin i, poc: 0 ng/mL (ref 0.00–0.08)

## 2017-12-27 LAB — APTT: APTT: 27 s (ref 24–36)

## 2017-12-27 NOTE — ED Triage Notes (Signed)
Pt presents with numbness to L shoulder, scapula, arm with tingling to bilat hands x2 days with new vision changes that began today; pt denies injury; pt states "I thought I had slept on it wrong but its been 2 days"; pt reports hx of anxiety, resp even and unlabored currently; unsure of CBGs d/t lack of equipment; pt also has hx of thoracic mass, one kidney

## 2017-12-28 ENCOUNTER — Encounter (HOSPITAL_COMMUNITY): Payer: Self-pay | Admitting: Emergency Medicine

## 2017-12-28 ENCOUNTER — Emergency Department (HOSPITAL_COMMUNITY)
Admission: EM | Admit: 2017-12-28 | Discharge: 2017-12-28 | Disposition: A | Discharge status: Home / Self Care | Payer: Self-pay | Attending: Emergency Medicine | Admitting: Emergency Medicine

## 2017-12-28 ENCOUNTER — Emergency Department (HOSPITAL_COMMUNITY): Payer: Self-pay

## 2017-12-28 DIAGNOSIS — M519 Unspecified thoracic, thoracolumbar and lumbosacral intervertebral disc disorder: Secondary | ICD-10-CM

## 2017-12-28 DIAGNOSIS — R202 Paresthesia of skin: Secondary | ICD-10-CM

## 2017-12-28 HISTORY — DX: Type 2 diabetes mellitus without complications: E11.9

## 2017-12-28 HISTORY — DX: Essential (primary) hypertension: I10

## 2017-12-28 HISTORY — DX: Disorder of kidney and ureter, unspecified: N28.9

## 2017-12-28 HISTORY — DX: Malignant (primary) neoplasm, unspecified: C80.1

## 2017-12-28 LAB — RAPID URINE DRUG SCREEN, HOSP PERFORMED
AMPHETAMINES: NOT DETECTED
Barbiturates: NOT DETECTED
Benzodiazepines: NOT DETECTED
Cocaine: NOT DETECTED
OPIATES: NOT DETECTED
TETRAHYDROCANNABINOL: NOT DETECTED

## 2017-12-28 MED ORDER — KETOROLAC TROMETHAMINE 30 MG/ML IJ SOLN
30.0000 mg | Freq: Once | INTRAMUSCULAR | Status: DC
  Filled 2017-12-28: qty 1

## 2017-12-28 MED ORDER — NAPROXEN 375 MG PO TABS: 375.0000 mg | ORAL_TABLET | Freq: Two times a day (BID) | ORAL | 0 refills | Status: DC

## 2017-12-28 MED ORDER — KETOROLAC TROMETHAMINE 30 MG/ML IJ SOLN
30.0000 mg | Freq: Once | INTRAMUSCULAR | Status: AC
Start: 2017-12-28 — End: 2017-12-28
  Administered 2017-12-28: 30 mg via INTRAMUSCULAR

## 2017-12-28 MED ORDER — DEXAMETHASONE SODIUM PHOSPHATE 10 MG/ML IJ SOLN
10.0000 mg | Freq: Once | INTRAMUSCULAR | Status: AC
  Administered 2017-12-28: 10 mg via INTRAMUSCULAR

## 2017-12-28 MED ORDER — DEXAMETHASONE SODIUM PHOSPHATE 10 MG/ML IJ SOLN
10.0000 mg | Freq: Once | INTRAMUSCULAR | Status: DC
  Filled 2017-12-28: qty 1

## 2017-12-28 NOTE — ED Notes (Addendum)
Pt did not answer for vitals.  

## 2017-12-28 NOTE — ED Provider Notes (Signed)
[facility] EMERGENCY DEPARTMENT Provider Note   CSN: [id number] Arrival date & time: 12/27/17  1943     History   Chief Complaint Chief Complaint  Patient presents with  . Numbness  . Visual Field Change  . Tingling    HPI Vanderbilt Ranieri is a 37 y.o. male.  The history is provided by the patient.  Eye Problem   This is a new problem. The current episode started more than 2 days ago. The problem occurs rarely (45 min of blurry vision while using computer.  ). The problem has been resolved. There is a problem in both eyes. There was no injury mechanism. The patient is experiencing no pain. There is no history of trauma to the eye. There is no known exposure to pink eye. He does not wear contacts. Associated symptoms include tingling. Pertinent negatives include no numbness, no decreased vision, no discharge, no double vision, no foreign body sensation, no photophobia, no eye redness, no nausea, no vomiting, no weakness and no itching. Associated symptoms comments: BUE when arms are down and dangling.  Marland Kitchen He has tried nothing for the symptoms. The treatment provided significant relief.  BUE tingling improved with arms elevated.  Washes dishes as work.  No changes in speech.  No numbness. No leg symptoms no facial weakness.    Past Medical History:  Diagnosis Date  . Cancer [facility])    as a child  . Diabetes mellitus without complication ([place])   . Hypertension   . Renal disorder     There are no active problems to display for this patient.   History reviewed. No pertinent surgical history.      Home Medications    Prior to Admission medications   Medication Sig Start Date End Date Taking? Authorizing Provider  Aspirin-Caffeine 845-65 MG PACK Take 1 Package by mouth every 6 (six) weeks.    [provider]  atorvastatin (LIPITOR) 10 MG tablet Take 10 mg by mouth daily.    [provider]  diclofenac (VOLTAREN) 75 MG EC tablet Take 1 tablet (75  mg total) by mouth 2 (two) times daily as needed. 02/14/15   Gregor Hams, MD  escitalopram (LEXAPRO) 20 MG tablet Take 20 mg by mouth daily.    [provider]  lisinopril (PRINIVIL,ZESTRIL) 2.5 MG tablet Take 2.5 mg by mouth daily.    [provider]  pantoprazole (PROTONIX) 40 MG tablet Take 40 mg by mouth daily.    [provider]    Family History History reviewed. No pertinent family history.  Social History Social History   Tobacco Use  . Smoking status: Current Every Day Smoker    Packs/day: 0.25    Types: Cigarettes  . Smokeless tobacco: Never Used  Substance Use Topics  . Alcohol use: Yes    Comment: social  . Drug use: Not on file     Allergies   Patient has no known allergies.   Review of Systems Review of Systems  Eyes: Negative for double vision, photophobia, discharge and redness.  Respiratory: Negative for cough and shortness of breath.   Cardiovascular: Negative for chest pain, palpitations and leg swelling.  Gastrointestinal: Negative for nausea and vomiting.  Genitourinary: Negative for difficulty urinating.  Musculoskeletal: Negative for back pain, myalgias and neck pain.  Skin: Negative for itching.  Neurological: Positive for tingling. Negative for dizziness, seizures, syncope, facial asymmetry, speech difficulty, weakness, light-headedness, numbness and headaches.  All other systems reviewed and are negative.  Physical Exam Updated Vital Signs BP (!) 164/91   Pulse 97   Temp 98.6 F (37 C) (Oral)   Resp 18   SpO2 99%   Physical Exam  Constitutional: He is oriented to person, place, and time. He appears well-developed and well-nourished. No distress.  HENT:  Head: Normocephalic and atraumatic.  Nose: Nose normal.  Mouth/Throat: Oropharynx is clear and moist. No oropharyngeal exudate.  Eyes: Pupils are equal, round, and reactive to light. Conjunctivae and EOM are normal.  Neck: Normal range of motion. Neck  supple.  Cardiovascular: Normal rate, regular rhythm, normal heart sounds and intact distal pulses.  Pulmonary/Chest: Effort normal and breath sounds normal. No stridor. He has no wheezes. He has no rales.  Abdominal: Soft. Bowel sounds are normal. He exhibits no mass. There is no tenderness. There is no rebound and no guarding.  Musculoskeletal: Normal range of motion.  Neurological: He is alert and oriented to person, place, and time. He displays normal reflexes. No cranial nerve deficit or sensory deficit. He exhibits normal muscle tone. Coordination normal.  Sensory is intact to confrontation states tingling is better with elevation   Skin: Skin is warm and dry. Capillary refill takes less than 2 seconds.  Psychiatric: He has a normal mood and affect.     ED Treatments / Results  Labs (all labs ordered are listed, but only abnormal results are displayed) Results for orders placed or performed during the hospital encounter of 12/28/17  Protime-INR  Result Value Ref Range   Prothrombin Time 12.4 11.4 - 15.2 seconds   INR 0.93   APTT  Result Value Ref Range   aPTT 27 24 - 36 seconds  CBC  Result Value Ref Range   WBC 6.3 4.0 - 10.5 K/uL   RBC 5.19 4.22 - 5.81 MIL/uL   Hemoglobin 15.4 13.0 - 17.0 g/dL   HCT 45.1 39.0 - 52.0 %   MCV 86.9 78.0 - 100.0 fL   MCH 29.7 26.0 - 34.0 pg   MCHC 34.1 30.0 - 36.0 g/dL   RDW 13.6 11.5 - 15.5 %   Platelets 169 150 - 400 K/uL  Differential  Result Value Ref Range   Neutrophils Relative % 76 %   Neutro Abs 4.9 1.7 - 7.7 K/uL   Lymphocytes Relative 17 %   Lymphs Abs 1.1 0.7 - 4.0 K/uL   Monocytes Relative 5 %   Monocytes Absolute 0.3 0.1 - 1.0 K/uL   Eosinophils Relative 1 %   Eosinophils Absolute 0.1 0.0 - 0.7 K/uL   Basophils Relative 1 %   Basophils Absolute 0.0 0.0 - 0.1 K/uL  Comprehensive metabolic panel  Result Value Ref Range   Sodium 136 135 - 145 mmol/L   Potassium 4.4 3.5 - 5.1 mmol/L   Chloride 100 (L) 101 - 111 mmol/L    CO2 22 22 - 32 mmol/L   Glucose, Bld 142 (H) 65 - 99 mg/dL   BUN 7 6 - 20 mg/dL   Creatinine, Ser 1.03 0.61 - 1.24 mg/dL   Calcium 9.7 8.9 - 10.3 mg/dL   Total Protein 7.9 6.5 - 8.1 g/dL   Albumin 4.4 3.5 - 5.0 g/dL   AST 228 (H) 15 - 41 U/L   ALT 110 (H) 17 - 63 U/L   Alkaline Phosphatase 85 38 - 126 U/L   Total Bilirubin 1.1 0.3 - 1.2 mg/dL   GFR calc non Af Amer >60 >60 mL/min   GFR calc Af Amer >60 >60 mL/min  Anion gap 14 5 - 15  I-stat troponin, ED  Result Value Ref Range   Troponin i, poc 0.00 0.00 - 0.08 ng/mL   Comment 3          I-Stat Chem 8, ED  Result Value Ref Range   Sodium 136 135 - 145 mmol/L   Potassium 4.5 3.5 - 5.1 mmol/L   Chloride 101 101 - 111 mmol/L   BUN 8 6 - 20 mg/dL   Creatinine, Ser 0.90 0.61 - 1.24 mg/dL   Glucose, Bld 137 (H) 65 - 99 mg/dL   Calcium, Ion 1.09 (L) 1.15 - 1.40 mmol/L   TCO2 26 22 - 32 mmol/L   Hemoglobin 17.0 13.0 - 17.0 g/dL   HCT 50.0 39.0 - 52.0 %   Ct Head Wo Contrast  Result Date: 12/27/2017 CLINICAL DATA:  Numbness to left shoulder, scapula and arm with tingling in both hands x2 days. You visual changes being today. No known injury. EXAM: CT HEAD WITHOUT CONTRAST CT CERVICAL SPINE WITHOUT CONTRAST TECHNIQUE: Multidetector CT imaging of the head and cervical spine was performed following the standard protocol without intravenous contrast. Multiplanar CT image reconstructions of the cervical spine were also generated. COMPARISON:  None. FINDINGS: CT HEAD FINDINGS BRAIN: The ventricles and sulci are normal. No intraparenchymal hemorrhage, mass effect nor midline shift. No acute large vascular territory infarcts. Grey-white matter distinction is maintained. The basal ganglia are unremarkable. No abnormal extra-axial fluid collections. Basal cisterns are not effaced and midline. The brainstem and cerebellar hemispheres are without acute abnormalities. VASCULAR: Unremarkable. SKULL/SOFT TISSUES: No skull fracture. No significant soft  tissue swelling. ORBITS/SINUSES: The included ocular globes and orbital contents are normal.The mastoid air cells are clear. The included paranasal sinuses are well-aerated. OTHER: None. CT CERVICAL SPINE FINDINGS Alignment: Mild reversal of cervical lordosis with 2 mm of retrolisthesis of C5 on C6 likely mediated by degenerative disc and facet arthropathy. There is mild dextroconvex curvature of the cervical spine as well. Intact atlantodental interval. Intact craniocervical relationship. Skull base and vertebrae: Intact skull base. No cervical spine fracture. Soft tissues and spinal canal: No prevertebral fluid or swelling. No visible canal hematoma. Disc levels: C2-C3:  Negative C3-C4: Left subarticular disc herniation impressing upon the ventral aspect of the thecal sac. No significant central or neural foraminal encroachment. C4-C5: Mild to moderate disc flattening with central to left central disc bulge. Left C4-5 uncinate spurring from uncovertebral joint osteoarthritis. Mild left-sided neural foraminal encroachment. C5-C6: Mild-to-moderate disc flattening with bilateral uncovertebral joint osteoarthritis and mild uncinate spurring contributing to mild bilateral neural foraminal encroachment. C6-C7: Mild disc flattening with central broad-based disc bulge. No significant neural foraminal encroachment or central canal stenosis. C7-T1: Slight disc flattening without focal disc herniation, canal stenosis nor neural foraminal encroachment. Upper chest: Negative. Other: None IMPRESSION: CT head: No acute intracranial abnormality. CT cervical spine: 1. Small left subarticular disc herniation C3-4. 2. Mild-to-moderate disc flattening with central to left central disc bulge at C4-5. Minimal left-sided neural foraminal encroachment from uncinate spurring at this level. 3. Mild-to-moderate disc flattening at C5-6 with bilateral uncovertebral joint osteoarthritis and mild uncinate spurring contributing to bilateral mild  neural foraminal encroachment. 4. Mild-to-moderate disc flattening central broad-based disc bulge at C6-7. Electronically Signed   By: Ashley Royalty M.D.   On: 12/27/2017 23:16   Ct Cervical Spine Wo Contrast  Result Date: 12/27/2017 CLINICAL DATA:  Numbness to left shoulder, scapula and arm with tingling in both hands x2 days. You visual changes being  today. No known injury. EXAM: CT HEAD WITHOUT CONTRAST CT CERVICAL SPINE WITHOUT CONTRAST TECHNIQUE: Multidetector CT imaging of the head and cervical spine was performed following the standard protocol without intravenous contrast. Multiplanar CT image reconstructions of the cervical spine were also generated. COMPARISON:  None. FINDINGS: CT HEAD FINDINGS BRAIN: The ventricles and sulci are normal. No intraparenchymal hemorrhage, mass effect nor midline shift. No acute large vascular territory infarcts. Grey-white matter distinction is maintained. The basal ganglia are unremarkable. No abnormal extra-axial fluid collections. Basal cisterns are not effaced and midline. The brainstem and cerebellar hemispheres are without acute abnormalities. VASCULAR: Unremarkable. SKULL/SOFT TISSUES: No skull fracture. No significant soft tissue swelling. ORBITS/SINUSES: The included ocular globes and orbital contents are normal.The mastoid air cells are clear. The included paranasal sinuses are well-aerated. OTHER: None. CT CERVICAL SPINE FINDINGS Alignment: Mild reversal of cervical lordosis with 2 mm of retrolisthesis of C5 on C6 likely mediated by degenerative disc and facet arthropathy. There is mild dextroconvex curvature of the cervical spine as well. Intact atlantodental interval. Intact craniocervical relationship. Skull base and vertebrae: Intact skull base. No cervical spine fracture. Soft tissues and spinal canal: No prevertebral fluid or swelling. No visible canal hematoma. Disc levels: C2-C3:  Negative C3-C4: Left subarticular disc herniation impressing upon the  ventral aspect of the thecal sac. No significant central or neural foraminal encroachment. C4-C5: Mild to moderate disc flattening with central to left central disc bulge. Left C4-5 uncinate spurring from uncovertebral joint osteoarthritis. Mild left-sided neural foraminal encroachment. C5-C6: Mild-to-moderate disc flattening with bilateral uncovertebral joint osteoarthritis and mild uncinate spurring contributing to mild bilateral neural foraminal encroachment. C6-C7: Mild disc flattening with central broad-based disc bulge. No significant neural foraminal encroachment or central canal stenosis. C7-T1: Slight disc flattening without focal disc herniation, canal stenosis nor neural foraminal encroachment. Upper chest: Negative. Other: None IMPRESSION: CT head: No acute intracranial abnormality. CT cervical spine: 1. Small left subarticular disc herniation C3-4. 2. Mild-to-moderate disc flattening with central to left central disc bulge at C4-5. Minimal left-sided neural foraminal encroachment from uncinate spurring at this level. 3. Mild-to-moderate disc flattening at C5-6 with bilateral uncovertebral joint osteoarthritis and mild uncinate spurring contributing to bilateral mild neural foraminal encroachment. 4. Mild-to-moderate disc flattening central broad-based disc bulge at C6-7. Electronically Signed   By: Ashley Royalty M.D.   On: 12/27/2017 23:16    EKG EKG Interpretation  Date/Time:  Sunday December 27 2017 20:41:43 EDT Ventricular Rate:  93 PR Interval:  96 QRS Duration: 82 QT Interval:  340 QTC Calculation: 422 R Axis:   92 Text Interpretation:  Sinus rhythm with short PR Right atrial enlargement Confirmed by Randal Buba, Faylinn Schwenn ([id number]) on 12/28/2017 2:56:50 AM   Radiology Ct Head Wo Contrast  Result Date: 12/27/2017 CLINICAL DATA:  Numbness to left shoulder, scapula and arm with tingling in both hands x2 days. You visual changes being today. No known injury. EXAM: CT HEAD WITHOUT CONTRAST CT  CERVICAL SPINE WITHOUT CONTRAST TECHNIQUE: Multidetector CT imaging of the head and cervical spine was performed following the standard protocol without intravenous contrast. Multiplanar CT image reconstructions of the cervical spine were also generated. COMPARISON:  None. FINDINGS: CT HEAD FINDINGS BRAIN: The ventricles and sulci are normal. No intraparenchymal hemorrhage, mass effect nor midline shift. No acute large vascular territory infarcts. Grey-white matter distinction is maintained. The basal ganglia are unremarkable. No abnormal extra-axial fluid collections. Basal cisterns are not effaced and midline. The brainstem and cerebellar hemispheres are without acute abnormalities. VASCULAR: Unremarkable.  SKULL/SOFT TISSUES: No skull fracture. No significant soft tissue swelling. ORBITS/SINUSES: The included ocular globes and orbital contents are normal.The mastoid air cells are clear. The included paranasal sinuses are well-aerated. OTHER: None. CT CERVICAL SPINE FINDINGS Alignment: Mild reversal of cervical lordosis with 2 mm of retrolisthesis of C5 on C6 likely mediated by degenerative disc and facet arthropathy. There is mild dextroconvex curvature of the cervical spine as well. Intact atlantodental interval. Intact craniocervical relationship. Skull base and vertebrae: Intact skull base. No cervical spine fracture. Soft tissues and spinal canal: No prevertebral fluid or swelling. No visible canal hematoma. Disc levels: C2-C3:  Negative C3-C4: Left subarticular disc herniation impressing upon the ventral aspect of the thecal sac. No significant central or neural foraminal encroachment. C4-C5: Mild to moderate disc flattening with central to left central disc bulge. Left C4-5 uncinate spurring from uncovertebral joint osteoarthritis. Mild left-sided neural foraminal encroachment. C5-C6: Mild-to-moderate disc flattening with bilateral uncovertebral joint osteoarthritis and mild uncinate spurring contributing to  mild bilateral neural foraminal encroachment. C6-C7: Mild disc flattening with central broad-based disc bulge. No significant neural foraminal encroachment or central canal stenosis. C7-T1: Slight disc flattening without focal disc herniation, canal stenosis nor neural foraminal encroachment. Upper chest: Negative. Other: None IMPRESSION: CT head: No acute intracranial abnormality. CT cervical spine: 1. Small left subarticular disc herniation C3-4. 2. Mild-to-moderate disc flattening with central to left central disc bulge at C4-5. Minimal left-sided neural foraminal encroachment from uncinate spurring at this level. 3. Mild-to-moderate disc flattening at C5-6 with bilateral uncovertebral joint osteoarthritis and mild uncinate spurring contributing to bilateral mild neural foraminal encroachment. 4. Mild-to-moderate disc flattening central broad-based disc bulge at C6-7. Electronically Signed   By: Ashley Royalty M.D.   On: 12/27/2017 23:16   Ct Cervical Spine Wo Contrast  Result Date: 12/27/2017 CLINICAL DATA:  Numbness to left shoulder, scapula and arm with tingling in both hands x2 days. You visual changes being today. No known injury. EXAM: CT HEAD WITHOUT CONTRAST CT CERVICAL SPINE WITHOUT CONTRAST TECHNIQUE: Multidetector CT imaging of the head and cervical spine was performed following the standard protocol without intravenous contrast. Multiplanar CT image reconstructions of the cervical spine were also generated. COMPARISON:  None. FINDINGS: CT HEAD FINDINGS BRAIN: The ventricles and sulci are normal. No intraparenchymal hemorrhage, mass effect nor midline shift. No acute large vascular territory infarcts. Grey-white matter distinction is maintained. The basal ganglia are unremarkable. No abnormal extra-axial fluid collections. Basal cisterns are not effaced and midline. The brainstem and cerebellar hemispheres are without acute abnormalities. VASCULAR: Unremarkable. SKULL/SOFT TISSUES: No skull fracture.  No significant soft tissue swelling. ORBITS/SINUSES: The included ocular globes and orbital contents are normal.The mastoid air cells are clear. The included paranasal sinuses are well-aerated. OTHER: None. CT CERVICAL SPINE FINDINGS Alignment: Mild reversal of cervical lordosis with 2 mm of retrolisthesis of C5 on C6 likely mediated by degenerative disc and facet arthropathy. There is mild dextroconvex curvature of the cervical spine as well. Intact atlantodental interval. Intact craniocervical relationship. Skull base and vertebrae: Intact skull base. No cervical spine fracture. Soft tissues and spinal canal: No prevertebral fluid or swelling. No visible canal hematoma. Disc levels: C2-C3:  Negative C3-C4: Left subarticular disc herniation impressing upon the ventral aspect of the thecal sac. No significant central or neural foraminal encroachment. C4-C5: Mild to moderate disc flattening with central to left central disc bulge. Left C4-5 uncinate spurring from uncovertebral joint osteoarthritis. Mild left-sided neural foraminal encroachment. C5-C6: Mild-to-moderate disc flattening with bilateral uncovertebral joint osteoarthritis  and mild uncinate spurring contributing to mild bilateral neural foraminal encroachment. C6-C7: Mild disc flattening with central broad-based disc bulge. No significant neural foraminal encroachment or central canal stenosis. C7-T1: Slight disc flattening without focal disc herniation, canal stenosis nor neural foraminal encroachment. Upper chest: Negative. Other: None IMPRESSION: CT head: No acute intracranial abnormality. CT cervical spine: 1. Small left subarticular disc herniation C3-4. 2. Mild-to-moderate disc flattening with central to left central disc bulge at C4-5. Minimal left-sided neural foraminal encroachment from uncinate spurring at this level. 3. Mild-to-moderate disc flattening at C5-6 with bilateral uncovertebral joint osteoarthritis and mild uncinate spurring  contributing to bilateral mild neural foraminal encroachment. 4. Mild-to-moderate disc flattening central broad-based disc bulge at C6-7. Electronically Signed   By: Ashley Royalty M.D.   On: 12/27/2017 23:16    Procedures Procedures (including critical care time)  Medications Ordered in ED Medications  ketorolac (TORADOL) 30 MG/ML injection 30 mg (has no administration in time range)  dexamethasone (DECADRON) injection 10 mg (has no administration in time range)   Case d/w Dr/ Cheral Marker does not need MRI of the brain.  Needs ophthalmology follow up and a PMD.              Visual Acuity  Right Eye Distance: 10/12.5 Left Eye Distance: 10/8 Bilateral Distance: 10/8  Right Eye Near:   Left Eye Near:    Bilateral Near:     Final Clinical Impressions(s) / ED Diagnoses  Return for weakness, numbness, changes in vision or speech, fevers >100.4 unrelieved by medication, shortness of breath, intractable vomiting, or diarrhea, abdominal pain, Inability to tolerate liquids or food, cough, altered mental status or any concerns. No signs of systemic illness or infection. The patient is nontoxic-appearing on exam and vital signs are within normal limits.   I have reviewed the triage vital signs and the nursing notes. Pertinent labs &imaging results that were available during my care of the patient were reviewed by me and considered in my medical decision making (see chart for details).  After history, exam, and medical workup I feel the patient has been appropriately medically screened and is safe for discharge home. Pertinent diagnoses were discussed with the patient. Patient was given return precautions.   Degenerative disk disease of the neck. Will need close follow    Graesyn Schreifels, MD 12/28/17 [id number]

## 2017-12-28 NOTE — ED Notes (Signed)
Patient transported to x-ray. ?

## 2017-12-28 NOTE — ED Notes (Addendum)
While going over d/c instructions the patient asks if he can drink after taking the medications he was given, states "it's time to drink". I advised him not to drink when taking any medication and not to drink and drive as well. States "I never drink and drive, someone else always drives me". He reports drinking a beer in the parking lot while waiting to be seen today.

## 2023-08-25 ENCOUNTER — Encounter: Payer: Self-pay | Admitting: Family Medicine

## 2023-08-25 ENCOUNTER — Ambulatory Visit: Payer: Medicaid Other | Admitting: Family Medicine

## 2023-08-25 VITALS — BP 130/80 | HR 100 | Resp 12 | Ht 64.0 in | Wt 126.0 lb

## 2023-08-25 DIAGNOSIS — M545 Low back pain, unspecified: Secondary | ICD-10-CM | POA: Diagnosis not present

## 2023-08-25 DIAGNOSIS — R7989 Other specified abnormal findings of blood chemistry: Secondary | ICD-10-CM | POA: Diagnosis not present

## 2023-08-25 DIAGNOSIS — E785 Hyperlipidemia, unspecified: Secondary | ICD-10-CM | POA: Diagnosis not present

## 2023-08-25 DIAGNOSIS — R200 Anesthesia of skin: Secondary | ICD-10-CM | POA: Diagnosis not present

## 2023-08-25 DIAGNOSIS — R29898 Other symptoms and signs involving the musculoskeletal system: Secondary | ICD-10-CM | POA: Diagnosis not present

## 2023-08-25 DIAGNOSIS — G8929 Other chronic pain: Secondary | ICD-10-CM | POA: Diagnosis not present

## 2023-08-25 DIAGNOSIS — R202 Paresthesia of skin: Secondary | ICD-10-CM

## 2023-08-25 DIAGNOSIS — E1169 Type 2 diabetes mellitus with other specified complication: Secondary | ICD-10-CM

## 2023-08-25 DIAGNOSIS — E119 Type 2 diabetes mellitus without complications: Secondary | ICD-10-CM | POA: Insufficient documentation

## 2023-08-25 LAB — LIPID PANEL
Cholesterol: 230 mg/dL — ABNORMAL HIGH (ref 0–200)
HDL: 97.3 mg/dL (ref >39.00)
LDL Cholesterol: 113 mg/dL — ABNORMAL HIGH (ref 0–99)
NonHDL: 132.49
Total CHOL/HDL Ratio: 2
Triglycerides: 97 mg/dL (ref 0.0–149.0)
VLDL: 19.4 mg/dL (ref 0.0–40.0)

## 2023-08-25 LAB — CBC
HCT: 43.2 % (ref 39.0–52.0)
Hemoglobin: 14.2 g/dL (ref 13.0–17.0)
MCHC: 32.8 g/dL (ref 30.0–36.0)
MCV: 91 fL (ref 78.0–100.0)
Platelets: 135 10*3/uL — ABNORMAL LOW (ref 150.0–400.0)
RBC: 4.75 Mil/uL (ref 4.22–5.81)
RDW: 15.7 % — ABNORMAL HIGH (ref 11.5–15.5)
WBC: 7.8 10*3/uL (ref 4.0–10.5)

## 2023-08-25 LAB — COMPREHENSIVE METABOLIC PANEL
ALT: 12 U/L (ref 0–53)
AST: 73 U/L — ABNORMAL HIGH (ref 0–37)
Albumin: 3.9 g/dL (ref 3.5–5.2)
Alkaline Phosphatase: 192 U/L — ABNORMAL HIGH (ref 39–117)
BUN: 6 mg/dL (ref 6–23)
CO2: 26 meq/L (ref 19–32)
Calcium: 9.5 mg/dL (ref 8.4–10.5)
Chloride: 97 meq/L (ref 96–112)
Creatinine, Ser: 0.76 mg/dL (ref 0.40–1.50)
GFR: 109.55 mL/min (ref >60.00)
Glucose, Bld: 124 mg/dL — ABNORMAL HIGH (ref 70–99)
Potassium: 3.8 meq/L (ref 3.5–5.1)
Sodium: 133 meq/L — ABNORMAL LOW (ref 135–145)
Total Bilirubin: 1.2 mg/dL (ref 0.2–1.2)
Total Protein: 8.2 g/dL (ref 6.0–8.3)

## 2023-08-25 LAB — HEMOGLOBIN A1C: Hgb A1c MFr Bld: 6.7 % — ABNORMAL HIGH (ref 4.6–6.5)

## 2023-08-25 LAB — MICROALBUMIN / CREATININE URINE RATIO
Creatinine,U: 24.2 mg/dL
Microalb Creat Ratio: 13.8 mg/g (ref 0.0–30.0)
Microalb, Ur: 3.3 mg/dL — ABNORMAL HIGH (ref 0.0–1.9)

## 2023-08-25 LAB — VITAMIN B12: Vitamin B-12: 416 pg/mL (ref 211–911)

## 2023-08-25 LAB — TSH: TSH: 6.07 u[IU]/mL — ABNORMAL HIGH (ref 0.35–5.50)

## 2023-08-25 LAB — C-REACTIVE PROTEIN: CRP: <1 mg/dL (ref 0.5–20.0)

## 2023-08-25 NOTE — Patient Instructions (Addendum)
A few things to remember from today's visit:  Numbness and tingling - Plan: Comprehensive metabolic panel, Vitamin B12, MR Lumbar Spine Wo Contrast, TSH  Chronic bilateral low back pain, unspecified whether sciatica present - Plan: MR Lumbar Spine Wo Contrast, Ambulatory referral to Physical Therapy  Weakness of both lower extremities - Plan: CBC, Comprehensive metabolic panel, C-reactive protein, MR Lumbar Spine Wo Contrast, TSH, Ambulatory referral to Physical Therapy  Type 2 diabetes mellitus with other specified complication, without long-term current use of insulin (HCC) - Plan: Hemoglobin A1c, Microalbumin / creatinine urine ratio  Hyperlipidemia, unspecified hyperlipidemia type - Plan: Lipid panel Will arrange lumbar imaging and PT. Fall precautions to continue. Further recommendations according to lab results.  Do not use My Chart to request refills or for acute issues that need immediate attention. If you send a my chart message, it may take a few days to be addressed, specially if I am not in the office.  Please be sure medication list is accurate. If a new problem present, please set up appointment sooner than planned today.

## 2023-08-25 NOTE — Progress Notes (Signed)
HPI: [name] is a 42 y.o. male with a PMHx significant for  DM II, HTN, anxiety, and depression who is here today to establish care.  Former PCP: He has not been seeing anyone regularly.  Last preventive routine visit: more than one year ago  Alcohol Use: He reports occasional alcohol usage. He'll drink a beer or two.  Smoking: He has smoked since he was 16. He smokes ~6 cigarettes per day.  Vision: He says he has never seen an eye doctor. He endorses occasional blurry vision.     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  Diabetes Mellitus II: dx'ed ~ 10 years ago  - Checking BG at home: He has not been checking his blood sugar. - Medications: He has not been on medication for several years.  - Eye exam: Never. Reports blurry vision "sometimes." - Foot exam: Over a year ago. - Negative for symptoms of hypoglycemia, polyuria, polydipsia, numbness extremities, foot ulcers/trauma  Hypertension:  Medications: He is not currently on a blood pressure medication, but has taken lisinopril in the past.  BP readings at home: He is not currently checking at home.  Negative for unusual or severe headache, visual changes, exertional chest pain, dyspnea,  focal weakness, or edema.  Lab Results  Component Value Date   CREATININE 0.90 12/27/2017   BUN 8 12/27/2017   NA 136 12/27/2017   K 4.5 12/27/2017   CL 101 12/27/2017   CO2 22 12/27/2017   Hyperlipidemia: Not currently on pharmacologic treatment.   Anxiety/depression:  He says his anxiety and depression come and go. He has taken medication in the past but is not currently.  He says he rarely leaves the house anymore, it exacerbates anxiety..   Leg pain:  Patient mentions he has had leg pain for between 6 months and a  year. He endorses associated numbness and tingling, and some back pain.  He believes the pain is getting worse.   He says he has fallen several times, most recently about 2 weeks ago. He is walking with a cane.  He takes tylenol regularly for leg pain.  Negative for edema or erythema. No saddle anesthesia or bladder/bowel dysfunction. He no longer drives, and has recently filed for disability because he was not allowed to use his cane in the kitchen at work.   Left arm numbness/tingling:  Patient also complains of left arm numbness and tingling since 2019.  He endorses associated pain in his left shoulder and neck at night which interferes with his sleep.  He mentions he had to do a lot of heavy lifting at a previous job.  He has not seen neurology or orthopedics for this problem.   Review of Systems  Constitutional:  Positive for fatigue. Negative for activity change, appetite change, chills and fever.  HENT:  Negative for nosebleeds, sore throat and trouble swallowing.   Respiratory:  Negative for cough and wheezing.   Gastrointestinal:  Negative for abdominal pain, nausea and vomiting.  Endocrine: Negative for cold intolerance and heat intolerance.  Genitourinary:  Negative for decreased urine volume, dysuria and hematuria.  Musculoskeletal:  Positive for arthralgias.  Skin:  Negative for rash.  Neurological:  Negative for syncope and facial asymmetry.  Psychiatric/Behavioral:  Negative for confusion and hallucinations.   See other pertinent positives and negatives in HPI.  No current outpatient medications on file prior to visit.   No current facility-administered medications on file prior to visit.   Past Medical History:  Diagnosis Date   Anxiety    Cancer (HCC)    as a child   Depression    Diabetes mellitus without complication (HCC)    Hypertension    Renal disorder    No Known Allergies  Family History  Problem Relation Age of Onset   Arthritis Mother     Diabetes Mother    Diabetes Sister    Asthma Sister     Social History   Socioeconomic History   Marital status: Single    Spouse name: Not on file   Number of children: Not on file   Years of education: Not on file   Highest education level: Not on file  Occupational History   Not on file  Tobacco Use   Smoking status: Every Day    Current packs/day: 0.25    Types: Cigarettes   Smokeless tobacco: Never  Substance and Sexual Activity   Alcohol use: Yes    Comment: social   Drug use: Not on file   Sexual activity: Not Currently  Other Topics Concern   Not on file  Social History Narrative   Not on file   Social Determinants of Health   Financial Resource Strain: Not on file  Food Insecurity: Not on file  Transportation Needs: Not on file  Physical Activity: Not on file  Stress: Not on file  Social Connections: Not on file    Vitals:   08/25/23 1117  BP: 130/80  Pulse: 100  Resp: 12  SpO2: 98%   Body mass index is 21.63 kg/m.  Physical Exam Vitals and nursing note reviewed.  Constitutional:      General: He is not in acute distress.    Appearance: He is well-developed.  HENT:     Head: Normocephalic and atraumatic.     Mouth/Throat:     Mouth: Mucous membranes are moist.     Pharynx: Oropharynx is clear. Uvula midline.  Eyes:     Conjunctiva/sclera: Conjunctivae normal.  Cardiovascular:     Rate and Rhythm: Normal rate and regular rhythm.     Pulses:          Posterior tibial pulses are 2+ on the right side and 2+ on the left side.     Heart sounds: No murmur heard. Pulmonary:     Effort: Pulmonary effort is normal. No respiratory distress.     Breath sounds: Normal breath sounds.  Abdominal:     Palpations: Abdomen is soft. There is no hepatomegaly or mass.     Tenderness: There is no abdominal tenderness.  Musculoskeletal:     Right lower leg: No edema.     Left lower leg: No edema.  Lymphadenopathy:     Cervical: No cervical adenopathy.   Skin:    General: Skin is warm.     Findings: No erythema or rash.  Neurological:     Mental Status: He is alert and oriented to person, place, and time.     Cranial Nerves: No cranial nerve deficit.     Gait: Gait normal.     Deep Tendon Reflexes:     Reflex Scores:      Patellar reflexes are 2+ on the right side and 2+ on the left side.    Comments: He is using a cane for assistance. Can get on and off examination table  with no assistance.  Psychiatric:        Mood and Affect: Affect normal. Mood is anxious.    ASSESSMENT AND PLAN: Mr. Matley was seen today to establish care.  Lab Results  Component Value Date   HGBA1C 6.7 (H) 08/25/2023   Lab Results  Component Value Date   NA 133 (L) 08/25/2023   CL 97 08/25/2023   K 3.8 08/25/2023   CO2 26 08/25/2023   BUN 6 08/25/2023   CREATININE 0.76 08/25/2023   GFR 109.55 08/25/2023   CALCIUM 9.5 08/25/2023   ALBUMIN 3.9 08/25/2023   GLUCOSE 124 (H) 08/25/2023   Lab Results  Component Value Date   ALT 12 08/25/2023   AST 73 (H) 08/25/2023   ALKPHOS 192 (H) 08/25/2023   BILITOT 1.2 08/25/2023   Lab Results  Component Value Date   CHOL 230 (H) 08/25/2023   HDL 97.30 08/25/2023   LDLCALC 113 (H) 08/25/2023   TRIG 97.0 08/25/2023   CHOLHDL 2 08/25/2023   Lab Results  Component Value Date   MICROALBUR 3.3 (H) 08/25/2023    Lab Results  Component Value Date   WBC 7.8 08/25/2023   HGB 14.2 08/25/2023   HCT 43.2 08/25/2023   MCV 91.0 08/25/2023   PLT 135.0 (L) 08/25/2023   Lab Results  Component Value Date   TSH 6.07 (H) 08/25/2023    Chronic bilateral low back pain, unspecified whether sciatica present Associated LE's weakness sensation and numbness. Lumbar MRI will be arranged as well as PT. Further recommendations according to imaging result.  -     MR LUMBAR SPINE WO CONTRAST; Future -     Ambulatory referral to Physical Therapy  Numbness and tingling LE's and LUE, chronic. LUE problem since 2019, ?  Radiculopathy. Cervical CT in 11/2017 mild to moderate disc flattening with disc bulge at different levels. Monitor for new symptoms.  -     Comprehensive metabolic panel; Future -     Vitamin B12; Future -     MR LUMBAR SPINE WO CONTRAST; Future -     TSH; Future  Weakness of both lower extremities Problem seems to be chronic. We discussed possible etiologies. Before considering neuro consultation, will obtain a lumbar MRI and try PT. Fall precautions discussed.  -     CBC; Future -     Comprehensive metabolic panel; Future -     C-reactive protein; Future -     MR LUMBAR SPINE WO CONTRAST; Future -     TSH; Future -     Ambulatory referral to Physical Therapy  Type 2 diabetes mellitus with other specified complication, without long-term current use of insulin (HCC) He is not monitoring BS's and has not had a HgA1C in a while. Continue non pharmacologic treatment, further recommendations will be given according to HgA1C result. Regular exercise and healthy diet with avoidance of added sugar food intake is an important part of treatment and recommended. Annual eye exam, periodic dental and foot care recommended. F/U in 4 months  -     Hemoglobin A1c; Future -     Microalbumin / creatinine urine ratio; Future  Hyperlipidemia, unspecified hyperlipidemia type Currently he is on non pharmacologic treatment. We discussed some CV benefits of statins. Further recommendations according to lipid panel result.  -     Lipid panel; Future   Return in about 14 weeks (around 12/01/2023) for chronic problems.  Trula Ore, acting as a scribe for Maryana Pittmon [place], MD., have  documented all relevant documentation on the behalf of Mukesh Kornegay [place], MD, as directed by  Sallye Lunz [place], MD while in the presence of Kaj Vasil [place], MD.   I, Aarini Slee [place], MD, have reviewed all documentation for this visit. The documentation on 08/28/23 for the exam, diagnosis, procedures, and orders are all accurate and  complete.  Suprina Mandeville G. [place], MD  [facility]. [place] office.

## 2023-08-28 ENCOUNTER — Encounter: Payer: Self-pay | Admitting: Family Medicine

## 2023-08-31 ENCOUNTER — Telehealth: Payer: Self-pay | Admitting: Family Medicine

## 2023-08-31 MED ORDER — ROSUVASTATIN CALCIUM 10 MG PO TABS: 10.0000 mg | ORAL_TABLET | Freq: Every day | ORAL | 3 refills | Status: DC

## 2023-08-31 NOTE — Telephone Encounter (Signed)
I called and spoke with patient. We went over all his questions and they were answered. Pt is unable to afford the copay for the ultrasound, so he will work on a low fat diet and decrease alcohol intake. FYI to PCP.   Rx sent in.

## 2023-08-31 NOTE — Telephone Encounter (Signed)
Pt is calling and has viewed his result on mychart and would like to go ahead a getting chole med and also there was some additional test md want pt to have. Please call pt back  [id number] [place],  - [address] AT [facility] [address] [facility] [address] [facility] [address] Phone: [phone]  Fax: [phone]

## 2023-09-14 ENCOUNTER — Encounter: Payer: Self-pay | Admitting: Family Medicine

## 2023-09-17 ENCOUNTER — Other Ambulatory Visit: Payer: Medicaid Other

## 2023-09-17 NOTE — Telephone Encounter (Signed)
Copied from CRM [id number]. Topic: Clinical - Medical Advice >> Sep 17, 2023  4:23 PM Almira Coaster wrote: Reason for CRM: Patient had an MRI scheduled for today and couldn't go through with it because of his anxiety, He doesn't take anxiety medication but feels like from 1-10 his anxiety is at a 7. He would like to speak to the nurse or Dr.Betty [place] if possible. Best call back number is [phone].

## 2023-09-18 ENCOUNTER — Other Ambulatory Visit: Payer: Self-pay | Admitting: Family Medicine

## 2023-09-18 MED ORDER — DIAZEPAM 5 MG PO TABS: 5.0000 mg | ORAL_TABLET | Freq: Once | ORAL | 0 refills | Status: AC

## 2023-09-18 NOTE — Telephone Encounter (Signed)
Has MRI scheduled for 09/2023, requesting something to help with anxiety prior to MRI.

## 2023-10-05 NOTE — Therapy (Deleted)
 OUTPATIENT PHYSICAL THERAPY THORACOLUMBAR EVALUATION   Patient Name: [name]. MRN: [id number] DOB:23-Feb-1981, 43 y.o., male Today's Date: 10/05/2023  END OF SESSION:   Past Medical History:  Diagnosis Date   Anxiety    Cancer [facility])    as a child   Depression    Diabetes mellitus without complication (HCC)    Hypertension    Renal disorder    No past surgical history on file. Patient Active Problem List   Diagnosis Date Noted   Diabetes mellitus (HCC) 08/25/2023   Chronic bilateral low back pain 08/25/2023   Numbness and tingling 08/25/2023   Hyperlipidemia 08/25/2023   Weakness of both lower extremities 08/25/2023    PCP: Jordan, Betty G, MD  REFERRING PROVIDER: Jordan, Betty G, MD  REFERRING DIAG: [phone] (ICD-10-CM) - Chronic bilateral low back pain, unspecified whether sciatica present R29.898 (ICD-10-CM) - Weakness of both lower extremities  Rationale for Evaluation and Treatment: Rehabilitation  THERAPY DIAG:  No diagnosis found.  ONSET DATE: ***  SUBJECTIVE:                                                                                                                                                                                           SUBJECTIVE STATEMENT: ***  PERTINENT HISTORY:  HTN; DM;   PAIN:  Are you having pain? Yes: NPRS scale: *** Pain location: *** Pain description: *** Aggravating factors: *** Relieving factors: ***  PRECAUTIONS: {Therapy precautions:24002}  RED FLAGS: {PT Red Flags:29287}   WEIGHT BEARING RESTRICTIONS: {Yes ***/No:24003}  FALLS:  Has patient fallen in last 6 months? {fallsyesno:27318}  LIVING ENVIRONMENT: Lives with: {[facility] lives with:25569::lives with their family} Lives in: {Lives in:25570} Stairs: {opstairs:27293} Has following equipment at home: {Assistive devices:23999}  OCCUPATION: ***  PLOF: {PLOF:24004}  PATIENT GOALS: ***  NEXT MD VISIT: ***  OBJECTIVE:  Note:  Objective measures were completed at Evaluation unless otherwise noted.  DIAGNOSTIC FINDINGS:  Imaging scheduled for 10/10/2023  PATIENT SURVEYS:  {rehab surveys:24030}  COGNITION: Overall cognitive status: {cognition:24006}     SENSATION: {sensation:27233}  MUSCLE LENGTH: Hamstrings: Right *** deg; Left *** deg Debby test: Right *** deg; Left *** deg  POSTURE: {posture:25561}  PALPATION: ***  LUMBAR ROM:   AROM eval  Flexion   Extension   Right lateral flexion   Left lateral flexion   Right rotation   Left rotation    (Blank rows = not tested)  LOWER EXTREMITY ROM:     {AROM/PROM:27142}  Right eval Left eval  Hip flexion    Hip extension    Hip abduction    Hip adduction    Hip  internal rotation    Hip external rotation    Knee flexion    Knee extension    Ankle dorsiflexion    Ankle plantarflexion    Ankle inversion    Ankle eversion     (Blank rows = not tested)  LOWER EXTREMITY MMT:    MMT Right eval Left eval  Hip flexion    Hip extension    Hip abduction    Hip adduction    Hip internal rotation    Hip external rotation    Knee flexion    Knee extension    Ankle dorsiflexion    Ankle plantarflexion    Ankle inversion    Ankle eversion     (Blank rows = not tested)  LUMBAR SPECIAL TESTS:  {lumbar special test:25242}  FUNCTIONAL TESTS:  {Functional tests:24029}  GAIT: Distance walked: *** Assistive device utilized: {Assistive devices:23999} Level of assistance: {Levels of assistance:24026} Comments: ***  TREATMENT DATE: ***                                                                                                                                 PATIENT EDUCATION:  Education details: *** Person educated: {Person educated:25204} Education method: {Education Method:25205} Education comprehension: {Education Comprehension:25206}  HOME EXERCISE PROGRAM: ***  ASSESSMENT:  CLINICAL IMPRESSION: Patient is a 43 y.o.  male who was seen today for physical therapy evaluation and treatment for chronic low back pain.   OBJECTIVE IMPAIRMENTS: {opptimpairments:25111}.   ACTIVITY LIMITATIONS: {activitylimitations:27494}  PARTICIPATION LIMITATIONS: {participationrestrictions:25113}  PERSONAL FACTORS: {Personal factors:25162} are also affecting patient's functional outcome.   REHAB POTENTIAL: {rehabpotential:25112}  CLINICAL DECISION MAKING: {clinical decision making:25114}  EVALUATION COMPLEXITY: {Evaluation complexity:25115}   GOALS: Goals reviewed with patient? {yes/no:20286}  SHORT TERM GOALS: Target date: ***  *** Baseline: Goal status: INITIAL  2.  *** Baseline:  Goal status: INITIAL  3.  *** Baseline:  Goal status: INITIAL  4.  *** Baseline:  Goal status: INITIAL  5.  *** Baseline:  Goal status: INITIAL  6.  *** Baseline:  Goal status: INITIAL  LONG TERM GOALS: Target date: ***  *** Baseline:  Goal status: INITIAL  2.  *** Baseline:  Goal status: INITIAL  3.  *** Baseline:  Goal status: INITIAL  4.  *** Baseline:  Goal status: INITIAL  5.  *** Baseline:  Goal status: INITIAL  6.  *** Baseline:  Goal status: INITIAL  PLAN:  PT FREQUENCY: {rehab frequency:25116}  PT DURATION: {rehab duration:25117}  PLANNED INTERVENTIONS: {rehab planned interventions:25118::97110-Therapeutic exercises,97530- Therapeutic [phone]- Neuromuscular re-education,97535- Self Rjmz,02859- Manual therapy}.  PLAN FOR NEXT SESSION: PIERRETTE Kristeen Sar, PT 10/05/2023, 11:58 AM

## 2023-10-06 ENCOUNTER — Ambulatory Visit: Payer: Medicaid Other | Admitting: Physical Therapy

## 2023-10-10 ENCOUNTER — Other Ambulatory Visit: Payer: Medicaid Other

## 2024-06-03 ENCOUNTER — Encounter: Payer: Self-pay | Admitting: Family Medicine

## 2024-06-03 ENCOUNTER — Ambulatory Visit: Admitting: Family Medicine

## 2024-06-03 ENCOUNTER — Ambulatory Visit: Payer: Self-pay | Admitting: Family Medicine

## 2024-06-03 VITALS — BP 136/90 | HR 90 | Resp 12 | Ht 64.0 in | Wt 126.2 lb

## 2024-06-03 DIAGNOSIS — D696 Thrombocytopenia, unspecified: Secondary | ICD-10-CM

## 2024-06-03 DIAGNOSIS — E1169 Type 2 diabetes mellitus with other specified complication: Secondary | ICD-10-CM | POA: Diagnosis not present

## 2024-06-03 DIAGNOSIS — F332 Major depressive disorder, recurrent severe without psychotic features: Secondary | ICD-10-CM

## 2024-06-03 DIAGNOSIS — F419 Anxiety disorder, unspecified: Secondary | ICD-10-CM

## 2024-06-03 DIAGNOSIS — R030 Elevated blood-pressure reading, without diagnosis of hypertension: Secondary | ICD-10-CM

## 2024-06-03 DIAGNOSIS — E039 Hypothyroidism, unspecified: Secondary | ICD-10-CM

## 2024-06-03 DIAGNOSIS — E785 Hyperlipidemia, unspecified: Secondary | ICD-10-CM | POA: Diagnosis not present

## 2024-06-03 DIAGNOSIS — R7989 Other specified abnormal findings of blood chemistry: Secondary | ICD-10-CM

## 2024-06-03 LAB — BASIC METABOLIC PANEL WITH GFR
BUN: 4 mg/dL — ABNORMAL LOW (ref 6–23)
CO2: 25 meq/L (ref 19–32)
Calcium: 8.8 mg/dL (ref 8.4–10.5)
Chloride: 97 meq/L (ref 96–112)
Creatinine, Ser: 0.67 mg/dL (ref 0.40–1.50)
GFR: 113.18 mL/min (ref >60.00)
Glucose, Bld: 124 mg/dL — ABNORMAL HIGH (ref 70–99)
Potassium: 3.9 meq/L (ref 3.5–5.1)
Sodium: 135 meq/L (ref 135–145)

## 2024-06-03 LAB — CBC WITH DIFFERENTIAL/PLATELET
Basophils Absolute: 0.1 K/uL (ref 0.0–0.1)
Basophils Relative: 1.3 % (ref 0.0–3.0)
Eosinophils Absolute: 0.2 K/uL (ref 0.0–0.7)
Eosinophils Relative: 2.7 % (ref 0.0–5.0)
HCT: 39.9 % (ref 39.0–52.0)
Hemoglobin: 13.2 g/dL (ref 13.0–17.0)
Lymphocytes Relative: 34.8 % (ref 12.0–46.0)
Lymphs Abs: 2.2 K/uL (ref 0.7–4.0)
MCHC: 33.1 g/dL (ref 30.0–36.0)
MCV: 88.7 fl (ref 78.0–100.0)
Monocytes Absolute: 0.7 K/uL (ref 0.1–1.0)
Monocytes Relative: 10.7 % (ref 3.0–12.0)
Neutro Abs: 3.1 K/uL (ref 1.4–7.7)
Neutrophils Relative %: 50.5 % (ref 43.0–77.0)
Platelets: 115 K/uL — ABNORMAL LOW (ref 150.0–400.0)
RBC: 4.5 Mil/uL (ref 4.22–5.81)
RDW: 16 % — ABNORMAL HIGH (ref 11.5–15.5)
WBC: 6.2 K/uL (ref 4.0–10.5)

## 2024-06-03 LAB — HEPATIC FUNCTION PANEL
ALT: 11 U/L (ref 0–53)
AST: 69 U/L — ABNORMAL HIGH (ref 0–37)
Albumin: 3.4 g/dL — ABNORMAL LOW (ref 3.5–5.2)
Alkaline Phosphatase: 226 U/L — ABNORMAL HIGH (ref 39–117)
Bilirubin, Direct: 0.8 mg/dL — ABNORMAL HIGH (ref 0.0–0.3)
Total Bilirubin: 1.8 mg/dL — ABNORMAL HIGH (ref 0.2–1.2)
Total Protein: 8.1 g/dL (ref 6.0–8.3)

## 2024-06-03 LAB — TSH: TSH: 8.58 u[IU]/mL — ABNORMAL HIGH (ref 0.35–5.50)

## 2024-06-03 LAB — HEMOGLOBIN A1C: Hgb A1c MFr Bld: 6.6 % — ABNORMAL HIGH (ref 4.6–6.5)

## 2024-06-03 LAB — T4, FREE: Free T4: 0.92 ng/dL (ref 0.60–1.60)

## 2024-06-03 MED ORDER — SERTRALINE HCL 25 MG PO TABS: 25.0000 mg | ORAL_TABLET | Freq: Every day | ORAL | 3 refills | Status: DC

## 2024-06-03 MED ORDER — ROSUVASTATIN CALCIUM 10 MG PO TABS: 10.0000 mg | ORAL_TABLET | Freq: Every day | ORAL | 3 refills | Status: DC

## 2024-06-03 NOTE — Progress Notes (Addendum)
 ACUTE VISIT Chief Complaint  Patient presents with   Anxiety   Discussed the use of AI scribe software for clinical note transcription with the patient, who gave verbal consent to proceed.  History of Present Illness [name]. is a 43 year old male with a PMHx significant for DM II, HTN, chronic pain,anxiety, and depression here today with concerns about anxiety and depression. He was last seen on 08/25/23. He states that his mother arranged appt.  He has a history of anxiety and depression, previously managed with Lexapro 20 mg, which he discontinued due to lack of insurance. While on Lexapro, he experienced hallucinations, specifically seeing 'snakes on the floor.'  He experiences variable sleep patterns, sometimes sleeping only 4-5 hours and other times sleeping much longer, 8 hours or more. He reports hearing noises and seeing shadows, which he attributes to his grandmother's presence in the house. He also reports feeling paranoic. No hx of bipolar disorders.  He smokes approximately one pack of cigarettes every four to five days and is attempting to decrease his smoking gradually. He lives with his parents and is currently unemployed.  No family history of depression, anxiety, or bipolar disorder reported.  He saw psychiatrist recently as part of his disability evaluation.    06/03/2024   11:48 PM  Depression screen PHQ 2/9  Decreased Interest 3  Down, Depressed, Hopeless 3  PHQ - 2 Score 6  Altered sleeping 3  Tired, decreased energy 3  Change in appetite 3  Feeling bad or failure about yourself  3  Trouble concentrating 3  Moving slowly or fidgety/restless 3  Suicidal thoughts 1  PHQ-9 Score 25  Difficult doing work/chores Very difficult      06/03/2024   11:49 PM  GAD 7 : Generalized Anxiety Score  Nervous, Anxious, on Edge 3  Control/stop worrying 3  Worry too much - different things 3  Trouble relaxing 3  Restless 3  Easily annoyed or irritable 3   Afraid - awful might happen 3  Total GAD 7 Score 21  Anxiety Difficulty Very difficult   He does not monitor his blood pressure or blood sugar at home.   He was previously prescribed rosuvastatin  for HLD, but he did not take it due to cost concerns. Lab Results  Component Value Date   CHOL 230 (H) 08/25/2023   HDL 97.30 08/25/2023   LDLCALC 113 (H) 08/25/2023   TRIG 97.0 08/25/2023   CHOLHDL 2 08/25/2023   He reports a history of abnormal liver function tests and was told he had a fatty liver.   His thyroid  function was also noted to be abnormal last visit. He experiences cold intolerance, which he attributes to low platelet counts. He has not noted bruising, nosebleed, gross hematuria,or melena.  Lab Results  Component Value Date   TSH 6.07 (H) 08/25/2023   Lab Results  Component Value Date   WBC 7.8 08/25/2023   HGB 14.2 08/25/2023   HCT 43.2 08/25/2023   MCV 91.0 08/25/2023   PLT 135.0 (L) 08/25/2023   Diabetes Mellitus II: dx'ed ~ 10 years ago  - Checking BG at home: He has not been checking his blood sugar. - On non pharmacologic treatment. - Eye exam: Never.  - Foot exam: Over a year ago. - Negative for symptoms of hypoglycemia, polyuria, polydipsia, foot ulcers/trauma  Lab Results  Component Value Date   HGBA1C 6.7 (H) 08/25/2023   BP mildly elevated today. In the past she has been  on Lisinopril. He does not monitor BP at home.  Lab Results  Component Value Date   NA 133 (L) 08/25/2023   CL 97 08/25/2023   K 3.8 08/25/2023   CO2 26 08/25/2023   BUN 6 08/25/2023   CREATININE 0.76 08/25/2023   GFR 109.55 08/25/2023   CALCIUM  9.5 08/25/2023   ALBUMIN 3.9 08/25/2023   GLUCOSE 124 (H) 08/25/2023   Review of Systems  Constitutional:  Negative for activity change, appetite change, chills and fever.  HENT:  Negative for sore throat.   Respiratory:  Negative for cough and wheezing.   Gastrointestinal:  Negative for abdominal pain, nausea and vomiting.   Endocrine: Negative for heat intolerance.  Genitourinary:  Negative for decreased urine volume, dysuria and hematuria.  Musculoskeletal:  Positive for arthralgias and gait problem.  Neurological:  Negative for syncope, facial asymmetry and headaches.  Psychiatric/Behavioral:  Negative for confusion.   See other pertinent positives and negatives in HPI.  Current Outpatient Medications on File Prior to Visit  Medication Sig Dispense Refill   rosuvastatin  (CRESTOR ) 10 MG tablet Take 1 tablet (10 mg total) by mouth daily. 30 tablet 3   No current facility-administered medications on file prior to visit.   Past Medical History:  Diagnosis Date   Anxiety    Cancer [facility])    as a child   Depression    Diabetes mellitus without complication (HCC)    Hypertension    Renal disorder    No Known Allergies  Social History   Socioeconomic History   Marital status: Single    Spouse name: Not on file   Number of children: Not on file   Years of education: Not on file   Highest education level: Not on file  Occupational History   Not on file  Tobacco Use   Smoking status: Every Day    Current packs/day: 0.25    Types: Cigarettes   Smokeless tobacco: Never  Substance and Sexual Activity   Alcohol use: Yes    Comment: social   Drug use: Not on file   Sexual activity: Not Currently  Other Topics Concern   Not on file  Social History Narrative   Not on file   Social Drivers of Health   Financial Resource Strain: Not on file  Food Insecurity: Not on file  Transportation Needs: Not on file  Physical Activity: Not on file  Stress: Not on file  Social Connections: Not on file   Vitals:   06/03/24 1021  BP: (!) 136/90  Pulse: 90  Resp: 12  SpO2: 99%   Body mass index is 21.67 kg/m.  Physical Exam Vitals and nursing note reviewed.  Constitutional:      General: He is not in acute distress.    Appearance: He is well-developed.  HENT:     Head: Normocephalic and  atraumatic.  Eyes:     Conjunctiva/sclera: Conjunctivae normal.  Cardiovascular:     Rate and Rhythm: Normal rate and regular rhythm.     Pulses:          Posterior tibial pulses are 2+ on the right side and 2+ on the left side.     Heart sounds: No murmur heard. Pulmonary:     Effort: Pulmonary effort is normal. No respiratory distress.     Breath sounds: Normal breath sounds.  Abdominal:     Palpations: Abdomen is soft. There is no mass.     Tenderness: There is no abdominal tenderness.  Musculoskeletal:  Right lower leg: No edema.     Left lower leg: No edema.  Skin:    General: Skin is warm.     Findings: No erythema or rash.  Neurological:     General: No focal deficit present.     Mental Status: He is alert and oriented to person, place, and time.     Comments: Gait assisted with a cane.  Psychiatric:        Mood and Affect: Mood is anxious.        Thought Content: Thought content does not include suicidal ideation. Thought content does not include suicidal plan.   ASSESSMENT AND PLAN:  [name] was seen today for anxiety.  Diagnoses and all orders for this visit:  Orders Placed This Encounter  Procedures   Microalbumin / creatinine urine ratio   Basic metabolic panel with GFR   Hepatic function panel   Hemoglobin A1c   T4, free   TSH   CBC with Differential/Platelet   Insulin and C-Peptide   Glutamic acid decarboxylase auto abs   Ambulatory referral to Psychiatry   Lab Results  Component Value Date   NA 135 06/03/2024   CL 97 06/03/2024   K 3.9 06/03/2024   CO2 25 06/03/2024   BUN 4 (L) 06/03/2024   CREATININE 0.67 06/03/2024   GFR 113.18 06/03/2024   CALCIUM  8.8 06/03/2024   ALBUMIN 3.4 (L) 06/03/2024   GLUCOSE 124 (H) 06/03/2024   Lab Results  Component Value Date   ALT 11 06/03/2024   AST 69 (H) 06/03/2024   ALKPHOS 226 (H) 06/03/2024   BILITOT 1.8 (H) 06/03/2024   Lab Results  Component Value Date   TSH 8.58 (H) 06/03/2024   Lab  Results  Component Value Date   WBC 6.2 06/03/2024   HGB 13.2 06/03/2024   HCT 39.9 06/03/2024   MCV 88.7 06/03/2024   PLT 115.0 (L) 06/03/2024   Lab Results  Component Value Date   HGBA1C 6.6 (H) 06/03/2024   Type 2 diabetes mellitus with other specified complication, without long-term current use of insulin (HCC) Assessment & Plan: HgA1C has been at goal. Continue non pharmacologic treatment. Regular exercise as tolerated and healthy diet with avoidance of added sugar food intake is an important part of treatment and recommended. Annual eye exam, periodic dental and foot care recommended.Declined foot exam today. F/U in 5-6 months  Orders: -     Microalbumin / creatinine urine ratio; Future -     Hemoglobin A1c; Future -     Insulin and C-Peptide; Future -     Glutamic acid decarboxylase auto abs; Future  Hyperlipidemia, unspecified hyperlipidemia type Assessment & Plan: He has not started Rosuvastatin , sent a new Rx to his pharmacy. CV benefits of statins discussed.  Orders: -     Basic metabolic panel with GFR; Future -     Hepatic function panel; Future -     Rosuvastatin  Calcium ; Take 1 tablet (10 mg total) by mouth daily.  Dispense: 30 tablet; Refill: 3  Acquired hypothyroidism Assessment & Plan: TSH 6.07 in 07/2023. Further recommendations according to TSH results.  Orders: -     T4, free; Future -     TSH; Future  Severe episode of recurrent major depressive disorder, without psychotic features [facility]) Assessment & Plan: He has taken Lexapro in the past. Report some possible visual and auditive hallucinations, no suicidal thoughts/ideation. Sertraline  25 mg started today. Some side effects discussed. Psychiatry referral placed. F/U in 4-6 weeks.  Orders: -     Ambulatory referral to Psychiatry -     Sertraline  HCl; Take 1 tablet (25 mg total) by mouth daily.  Dispense: 30 tablet; Refill: 3  Anxiety disorder, unspecified type Assessment &  Plan: GAD. Sertraline  25 mg started today. Psychiatry referral placed. F/U in 4-6 weeks.  Orders: -     Ambulatory referral to Psychiatry -     Sertraline  HCl; Take 1 tablet (25 mg total) by mouth daily.  Dispense: 30 tablet; Refill: 3  Thrombocytopenia (HCC) Mild, 135,000 in 07/2024. Further recommendations according to lab results.  -     CBC with Differential/Platelet; Future  Elevated blood pressure reading Recommend monitoring BP at home regularly. Low salt diet.  I personally spent a total of 44 minutes in the care of the patient today including preparing to see the patient, getting/reviewing separately obtained history, performing a medically appropriate exam/evaluation, counseling and educating, placing orders, documenting clinical information in the EHR, and communicating results.   Return in about 6 weeks (around 07/15/2024).   [name] G. [place], MD  [facility]. [place] office.

## 2024-06-03 NOTE — Patient Instructions (Addendum)
 A few things to remember from today's visit:  Type 2 diabetes mellitus with other specified complication, without long-term current use of insulin (HCC) - Plan: Microalbumin / creatinine urine ratio, Hemoglobin A1c, Insulin and C-Peptide, Glutamic acid decarboxylase auto abs  Hyperlipidemia, unspecified hyperlipidemia type - Plan: Basic metabolic panel with GFR, Hepatic function panel  Severe episode of recurrent major depressive disorder, without psychotic features (HCC) - Plan: Ambulatory referral to Psychiatry, sertraline  (ZOLOFT ) 25 MG tablet  Anxiety disorder, unspecified type - Plan: Ambulatory referral to Psychiatry, sertraline  (ZOLOFT ) 25 MG tablet  Elevated TSH - Plan: T4, free, TSH  Thrombocytopenia (HCC) - Plan: CBC with Differential/Platelet  Elevated blood pressure reading  Today we started Sertraline , this type of medications can increase suicidal risk. This is more prevalent among children,adolecents, and young adults with major depression or other psychiatric disorders. It can also make depression worse. Most common side effects are gastrointestinal, self limited after a few weeks: diarrhea, nausea, constipation  Or diarrhea among some.  In general it is well tolerated. We will follow closely.  Monitor blood pressure at home and bring some readings with you next visit.   If you need refills for medications you take chronically, please call your pharmacy. Do not use My Chart to request refills or for acute issues that need immediate attention. If you send a my chart message, it may take a few days to be addressed, specially if I am not in the office.  Please be sure medication list is accurate. If a new problem present, please set up appointment sooner than planned today.

## 2024-06-03 NOTE — Assessment & Plan Note (Signed)
 HgA1C has been at goal. Continue non pharmacologic treatment. Regular exercise as tolerated and healthy diet with avoidance of added sugar food intake is an important part of treatment and recommended. Annual eye exam, periodic dental and foot care recommended.Declined foot exam today. F/U in 5-6 months

## 2024-06-03 NOTE — Assessment & Plan Note (Signed)
 He has not started Rosuvastatin , sent a new Rx to his pharmacy. CV benefits of statins discussed.

## 2024-06-03 NOTE — Assessment & Plan Note (Signed)
 TSH 6.07 in 07/2023. Further recommendations according to TSH results.

## 2024-06-03 NOTE — Assessment & Plan Note (Signed)
 GAD. Sertraline  25 mg started today. Psychiatry referral placed. F/U in 4-6 weeks.

## 2024-06-03 NOTE — Assessment & Plan Note (Signed)
 He has taken Lexapro in the past. Report some possible visual and auditive hallucinations, no suicidal thoughts. Sertraline  25 mg started today. Some side effects discussed. Psychiatry referral placed. F/U in 4-6 weeks.

## 2024-06-07 LAB — GLUTAMIC ACID DECARBOXYLASE AUTO ABS: Glutamic Acid Decarb Ab: <5 [IU]/mL (ref <5)

## 2024-06-13 ENCOUNTER — Telehealth: Payer: Self-pay | Admitting: Family Medicine

## 2024-06-13 NOTE — Telephone Encounter (Signed)
 Patient dropped off document [place] [employer], to be filled out by provider. Patient requested to send it back via Call Patient to pick up within 7-days. Document is located in providers tray at front office.Please advise at [phone] mother.

## 2024-06-16 LAB — INSULIN AND C-PEPTIDE, SERUM: INSULIN: 8.6 u[IU]/mL (ref 2.6–24.9)

## 2024-06-17 NOTE — Telephone Encounter (Signed)
 I reviewed document, it seems like a disability form, which I do not do. I can complete it based on his visits with me, recently established, have seen his 2 times. May need input from specialist. Thanks, BJ

## 2024-06-23 NOTE — Telephone Encounter (Signed)
 Copied from CRM #[id number]. Topic: Medical Record Request - Other >> Jun 23, 2024  3:45 PM Anairis L wrote: Reason for CRM: Patient is requesting a call to know if his paperwork is ready for pick-up .  Thank you.

## 2024-06-24 NOTE — Telephone Encounter (Signed)
 Spoke with patient. Advised patient per Dr. [place] she does not complete disability forms. Advised patient that per Dr. [place], she has only seen patient twice and could only recommend seated duty. Patient stated the forms are for his [employer] benefits. Patient became upset and stated how is he going to get a job when he walks in with a cane. Patient stated that he can barely go to the bathroom or mailbox within his cane. Patient states what can he do sitting, I have no computer skills. Advised patient there may be other duties he could do besides working with computer and that it is against the law to discriminate against individuals with disabilities. Patient stated DEI is out the window and your president made sure of that. Advised patient I would let provider know and she can complete the paperwork to the best of her ability based off of his visits and office will contact him when ready. Patient voiced understanding.

## 2024-06-27 NOTE — Telephone Encounter (Signed)
 Forms have been completed and signed. Thanks, BJ

## 2024-06-28 ENCOUNTER — Encounter: Payer: Self-pay | Admitting: *Deleted

## 2024-06-28 NOTE — Telephone Encounter (Signed)
 Patient is aware the form was completed, PCP charged a $29 fee and this was left at the front desk for pick up.  Copy sent to be scanned also.

## 2024-06-29 ENCOUNTER — Encounter: Payer: Self-pay | Admitting: Family Medicine

## 2024-06-29 ENCOUNTER — Ambulatory Visit: Admitting: Family Medicine

## 2024-06-29 VITALS — BP 155/90 | HR 88 | Temp 99.9°F | Resp 18 | Ht 64.0 in | Wt 127.6 lb

## 2024-06-29 DIAGNOSIS — R1084 Generalized abdominal pain: Secondary | ICD-10-CM

## 2024-06-29 DIAGNOSIS — K529 Noninfective gastroenteritis and colitis, unspecified: Secondary | ICD-10-CM | POA: Diagnosis not present

## 2024-06-29 DIAGNOSIS — I10 Essential (primary) hypertension: Secondary | ICD-10-CM | POA: Diagnosis not present

## 2024-06-29 DIAGNOSIS — R112 Nausea with vomiting, unspecified: Secondary | ICD-10-CM

## 2024-06-29 MED ORDER — ONDANSETRON HCL 4 MG PO TABS: 4.0000 mg | ORAL_TABLET | Freq: Three times a day (TID) | ORAL | 0 refills | Status: AC | PRN

## 2024-06-29 NOTE — Patient Instructions (Addendum)
 A few things to remember from today's visit:  Generalized abdominal pain - Plan: Comprehensive metabolic panel with GFR, CBC with Differential/Platelet, CT ABDOMEN PELVIS W CONTRAST, C-reactive protein  Nausea and vomiting in adult patient - Plan: Comprehensive metabolic panel with GFR, CBC with Differential/Platelet, C-reactive protein, ondansetron (ZOFRAN) 4 MG tablet  Gastroenteritis, acute Monitor for new symptoms like fever. If abdominal pain suddenly worse, go to the ED. Monitor blood pressure.  If you need refills for medications you take chronically, please call your pharmacy. Do not use My Chart to request refills or for acute issues that need immediate attention. If you send a my chart message, it may take a few days to be addressed, specially if I am not in the office.  Please be sure medication list is accurate. If a new problem present, please set up appointment sooner than planned today.

## 2024-06-29 NOTE — Progress Notes (Addendum)
 "  ACUTE VISIT Chief Complaint  Patient presents with   Bloated   Abdominal Pain   Diarrhea   Emesis    Symptoms began 1 1/2 weeks ago.   Discussed the use of AI scribe software for clinical note transcription with the patient, who gave verbal consent to proceed.  History of Present Illness Keith Human. is a 43 year old male with past medical history significant for DM 2, hypertension, chronic pain disorder, anxiety, and depression who presents with ten days of bloating, abdominal pain, diarrhea, nausea, and vomiting.  The abdominal pain affects both sides and the lower part of the abdomen, is described as constant, and has a severity of 7 to 8 out of 10. The pain varies between achy and sharp, and his belly button has become more prominent due to bloating/distention.  He has diarrhea approximately three to four times per day, with brown stools and no blood or black discoloration. No recent travel, contact with sick individuals, or consumption of suspicious food. He has not been on antibiotics recently.  He reports a significant decrease in appetite, having not eaten in three days, and attributes his nausea to this lack of food intake. He vomited once last night but has not vomited in the days prior. He feels fatigued and has experienced decreased urination, despite drinking water.  He has a history of anxiety and depression for which he takes sertraline , but he has not taken it recently due to lack of food intake.  Hx of abdominal surgeries. No passage of gas unless during bowel movements. He has not checked his temperature regularly.  Negative for urinary symptoms.  Hypertension: Currently he is not on pharmacologic treatment. He has not been monitoring BP at home. Lab Results  Component Value Date   NA 135 06/03/2024   CL 97 06/03/2024   K 3.9 06/03/2024   CO2 25 06/03/2024   BUN 4 (L) 06/03/2024   CREATININE 0.67 06/03/2024   GFR 113.18 06/03/2024   CALCIUM  8.8  06/03/2024   ALBUMIN 3.4 (L) 06/03/2024   GLUCOSE 124 (H) 06/03/2024   Review of Systems  Constitutional:  Positive for activity change, appetite change and fatigue. Negative for chills.  HENT:  Negative for sore throat and trouble swallowing.   Respiratory:  Negative for cough, shortness of breath and wheezing.   Cardiovascular:  Negative for chest pain, palpitations and leg swelling.  Endocrine: Negative for cold intolerance and heat intolerance.  Genitourinary:  Positive for decreased urine volume. Negative for dysuria and hematuria.  Skin:  Negative for rash.  Neurological:  Negative for syncope, facial asymmetry and headaches.  Psychiatric/Behavioral:  Negative for confusion and hallucinations.   See other pertinent positives and negatives in HPI.  Current Outpatient Medications on File Prior to Visit  Medication Sig Dispense Refill   rosuvastatin  (CRESTOR ) 10 MG tablet Take 1 tablet (10 mg total) by mouth daily. 30 tablet 3   sertraline  (ZOLOFT ) 25 MG tablet Take 1 tablet (25 mg total) by mouth daily. 30 tablet 3   No current facility-administered medications on file prior to visit.   Past Medical History:  Diagnosis Date   Anxiety    Cancer [facility])    as a child   Depression    Diabetes mellitus without complication (HCC)    Hypertension    Renal disorder    No Known Allergies  Social History   Socioeconomic History   Marital status: Single    Spouse name: Not on file  Number of children: Not on file   Years of education: Not on file   Highest education level: Not on file  Occupational History   Not on file  Tobacco Use   Smoking status: Every Day    Current packs/day: 0.25    Types: Cigarettes   Smokeless tobacco: Never  Substance and Sexual Activity   Alcohol use: Yes    Comment: social   Drug use: Not on file   Sexual activity: Not Currently  Other Topics Concern   Not on file  Social History Narrative   Not on file   Social Drivers of Health    Financial Resource Strain: Not on file  Food Insecurity: Not on file  Transportation Needs: Not on file  Physical Activity: Not on file  Stress: Not on file  Social Connections: Not on file   Vitals:   06/29/24 1127 06/29/24 1159  BP: (!) 170/90 (!) 155/90  Pulse: 88   Resp: 18   Temp: 99.9 F (37.7 C)   SpO2: 98%    Body mass index is 21.9 kg/m.  Physical Exam Vitals and nursing note reviewed.  Constitutional:      General: He is not in acute distress.    Appearance: He is well-developed. He is not ill-appearing or toxic-appearing.  HENT:     Head: Normocephalic and atraumatic.     Mouth/Throat:     Mouth: Mucous membranes are moist.  Eyes:     Conjunctiva/sclera: Conjunctivae normal.  Cardiovascular:     Rate and Rhythm: Normal rate and regular rhythm.     Heart sounds: No murmur heard. Pulmonary:     Effort: Pulmonary effort is normal. No respiratory distress.     Breath sounds: Normal breath sounds.  Abdominal:     General: Bowel sounds are normal.     Palpations: Abdomen is soft. There is no hepatomegaly or mass.     Tenderness: There is abdominal tenderness in the right lower quadrant and periumbilical area. There is no guarding or rebound. Negative signs include McBurney's sign.     Hernia: No hernia is present.  Skin:    General: Skin is warm.     Findings: No erythema.  Neurological:     General: No focal deficit present.     Mental Status: He is alert and oriented to person, place, and time.     Comments: He uses a cane for assistance.  Psychiatric:        Mood and Affect: Mood and affect normal.    ASSESSMENT AND PLAN:  [name] was seen today for abdominal pain, nausea, vomiting, and diarrhea. Lab Results  Component Value Date   NA 136 06/29/2024   CL 96 06/29/2024   K 4.7 06/29/2024   CO2 16 (L) 06/29/2024   BUN 6 06/29/2024   CREATININE 0.75 (L) 06/29/2024   EGFR 113 06/29/2024   CALCIUM  8.9 06/29/2024   ALBUMIN 3.3 (L) 06/29/2024    GLUCOSE 56 (L) 06/29/2024   Lab Results  Component Value Date   ALT 9 06/29/2024   AST 73 (H) 06/29/2024   ALKPHOS 273 (H) 06/29/2024   BILITOT 1.8 (H) 06/29/2024   Lab Results  Component Value Date   WBC 8.5 06/29/2024   HGB 13.1 06/29/2024   HCT 39.5 06/29/2024   MCV 91 06/29/2024   PLT 140 (L) 06/29/2024   Generalized abdominal pain With associated nausea, vomiting, diarrhea. Examination today does not suggest an acute abdomen, so I do not think  he needs to go to the ER. Because symptoms had been persistent for 10 days, recommend stat abdominal/pelvis CT. He was clearly instructed about warning signs. Further recommendation will be given according to lab results.  -     Comprehensive metabolic panel with GFR; Future -     CBC with Differential/Platelet; Future -     CT ABDOMEN PELVIS W CONTRAST; Future -     C-reactive protein; Future  Nausea and vomiting in adult patient At this time he denies having nausea and last episode of vomiting was yesterday. Recommend Zofran  4 mg 3 times daily as needed. Continue advancing diet as tolerated. Instructed about warning signs.  -     Comprehensive metabolic panel with GFR; Future -     CBC with Differential/Platelet; Future -     C-reactive protein; Future -     Ondansetron  HCl; Take 1 tablet (4 mg total) by mouth every 8 (eight) hours as needed for up to 5 days for nausea or vomiting.  Dispense: 15 tablet; Refill: 0  Gastroenteritis, acute All symptoms she reported today could be caused by a viral gastroenteritis. Nausea and vomiting have improved. Abdominal examination does not suggest a serious process. Abdomen/pelvis CT was ordered. Continue adequate hydration.  Essential (primary) hypertension  He used to be on lisinopril in the past. Last visit, earlier this month, BP was also mildly elevated. Recommend monitoring BP at home regularly.  I personally spent a total of 40 minutes in the care of the patient today  including preparing to see the patient, getting/reviewing separately obtained history, performing a medically appropriate exam/evaluation, counseling and educating, placing orders, and documenting clinical information in the EHR.  Return in about 1 week (around 07/06/2024).  Anberlyn Feimster G. [place] Folden, MD  [facility]. [place] office.   "

## 2024-06-30 ENCOUNTER — Telehealth: Payer: Self-pay | Admitting: Family Medicine

## 2024-06-30 ENCOUNTER — Encounter: Payer: Self-pay | Admitting: *Deleted

## 2024-06-30 LAB — COMPREHENSIVE METABOLIC PANEL WITH GFR
ALT: 9 IU/L (ref 0–44)
AST: 73 IU/L — ABNORMAL HIGH (ref 0–40)
Albumin: 3.3 g/dL — ABNORMAL LOW (ref 4.1–5.1)
Alkaline Phosphatase: 273 IU/L — ABNORMAL HIGH (ref 47–123)
BUN/Creatinine Ratio: 8 — ABNORMAL LOW (ref 9–20)
BUN: 6 mg/dL (ref 6–24)
Bilirubin Total: 1.8 mg/dL — ABNORMAL HIGH (ref 0.0–1.2)
CO2: 16 mmol/L — ABNORMAL LOW (ref 20–29)
Calcium: 8.9 mg/dL (ref 8.7–10.2)
Chloride: 96 mmol/L (ref 96–106)
Creatinine, Ser: 0.75 mg/dL — ABNORMAL LOW (ref 0.76–1.27)
Globulin, Total: 4.5 g/dL (ref 1.5–4.5)
Glucose: 56 mg/dL — ABNORMAL LOW (ref 70–99)
Potassium: 4.7 mmol/L (ref 3.5–5.2)
Sodium: 136 mmol/L (ref 134–144)
Total Protein: 7.8 g/dL (ref 6.0–8.5)
eGFR: 113 mL/min/1.73 (ref >59)

## 2024-06-30 LAB — CBC WITH DIFFERENTIAL/PLATELET
Basophils Absolute: 0.1 x10E3/uL (ref 0.0–0.2)
Basos: 1 %
EOS (ABSOLUTE): 0.1 x10E3/uL (ref 0.0–0.4)
Eos: 1 %
Hematocrit: 39.5 % (ref 37.5–51.0)
Hemoglobin: 13.1 g/dL (ref 13.0–17.7)
Immature Grans (Abs): 0 x10E3/uL (ref 0.0–0.1)
Immature Granulocytes: 0 %
Lymphocytes Absolute: 2.4 x10E3/uL (ref 0.7–3.1)
Lymphs: 29 %
MCH: 30 pg (ref 26.6–33.0)
MCHC: 33.2 g/dL (ref 31.5–35.7)
MCV: 91 fL (ref 79–97)
Monocytes Absolute: 1.2 x10E3/uL — ABNORMAL HIGH (ref 0.1–0.9)
Monocytes: 14 %
Neutrophils Absolute: 4.7 x10E3/uL (ref 1.4–7.0)
Neutrophils: 55 %
Platelets: 140 x10E3/uL — ABNORMAL LOW (ref 150–450)
RBC: 4.36 x10E6/uL (ref 4.14–5.80)
RDW: 14.4 % (ref 11.6–15.4)
WBC: 8.5 x10E3/uL (ref 3.4–10.8)

## 2024-06-30 NOTE — Telephone Encounter (Signed)
 Disability form completed. Based on evaluations from prior visits x 2, I recommended that he can work part time, mostly seated, and eligible for vocational rehab. He uses a cane for transfer. Evaluated on 06/29/24 for acute illness. He was able to get on examination table , get on step and sit, with no assistance. He move to the side of table and jump to floor to get down, no unstable gait or antalgic gait appreciated when he walked a few steps to go back to seat. Donnella Morford [place], MD

## 2024-07-02 ENCOUNTER — Ambulatory Visit: Payer: Self-pay | Admitting: Family Medicine

## 2024-07-02 DIAGNOSIS — E876 Hypokalemia: Secondary | ICD-10-CM

## 2024-07-02 DIAGNOSIS — R188 Other ascites: Secondary | ICD-10-CM

## 2024-07-02 DIAGNOSIS — E039 Hypothyroidism, unspecified: Secondary | ICD-10-CM

## 2024-07-05 ENCOUNTER — Ambulatory Visit (HOSPITAL_BASED_OUTPATIENT_CLINIC_OR_DEPARTMENT_OTHER)

## 2024-07-06 ENCOUNTER — Ambulatory Visit: Admitting: Family Medicine

## 2024-07-09 ENCOUNTER — Ambulatory Visit (HOSPITAL_BASED_OUTPATIENT_CLINIC_OR_DEPARTMENT_OTHER)
Admission: RE | Admit: 2024-07-09 | Discharge: 2024-07-09 | Disposition: A | Source: Ambulatory Visit | Attending: Family Medicine | Admitting: Family Medicine

## 2024-07-09 DIAGNOSIS — R1084 Generalized abdominal pain: Secondary | ICD-10-CM | POA: Insufficient documentation

## 2024-07-09 MED ORDER — IOHEXOL 300 MG/ML  SOLN
100.0000 mL | Freq: Once | INTRAMUSCULAR | Status: AC | PRN
  Administered 2024-07-09: 100 mL via INTRAVENOUS

## 2024-07-11 ENCOUNTER — Other Ambulatory Visit: Payer: Self-pay | Admitting: Family Medicine

## 2024-07-11 DIAGNOSIS — R7989 Other specified abnormal findings of blood chemistry: Secondary | ICD-10-CM

## 2024-07-11 DIAGNOSIS — R188 Other ascites: Secondary | ICD-10-CM

## 2024-07-15 ENCOUNTER — Ambulatory Visit: Admitting: Family Medicine

## 2024-07-15 ENCOUNTER — Encounter: Payer: Self-pay | Admitting: Family Medicine

## 2024-07-15 VITALS — BP 160/100 | HR 100 | Temp 97.9°F | Resp 16 | Ht 64.0 in | Wt 133.4 lb

## 2024-07-15 DIAGNOSIS — R7989 Other specified abnormal findings of blood chemistry: Secondary | ICD-10-CM

## 2024-07-15 DIAGNOSIS — K7031 Alcoholic cirrhosis of liver with ascites: Secondary | ICD-10-CM | POA: Diagnosis not present

## 2024-07-15 DIAGNOSIS — I10 Essential (primary) hypertension: Secondary | ICD-10-CM

## 2024-07-15 DIAGNOSIS — E039 Hypothyroidism, unspecified: Secondary | ICD-10-CM | POA: Diagnosis not present

## 2024-07-15 DIAGNOSIS — R011 Cardiac murmur, unspecified: Secondary | ICD-10-CM

## 2024-07-15 DIAGNOSIS — N400 Enlarged prostate without lower urinary tract symptoms: Secondary | ICD-10-CM

## 2024-07-15 DIAGNOSIS — F1020 Alcohol dependence, uncomplicated: Secondary | ICD-10-CM | POA: Diagnosis not present

## 2024-07-15 MED ORDER — SPIRONOLACTONE 25 MG PO TABS: 25.0000 mg | ORAL_TABLET | Freq: Every day | ORAL | 0 refills | Status: DC

## 2024-07-15 MED ORDER — FUROSEMIDE 20 MG PO TABS: 20.0000 mg | ORAL_TABLET | Freq: Two times a day (BID) | ORAL | 3 refills | Status: DC

## 2024-07-15 MED ORDER — THIAMINE HCL 100 MG PO TABS: 100.0000 mg | ORAL_TABLET | Freq: Every day | ORAL | 3 refills | Status: AC

## 2024-07-15 NOTE — Assessment & Plan Note (Addendum)
 Problem has not been well controlled. We discussed possible complications of elevated BP. Spironolactone 25 mg started today. BMP in 2-3 weeks. Recommend monitoring BP's at home and to let me know about reading sin 2 weeks. Instructed about warning signs.

## 2024-07-15 NOTE — Assessment & Plan Note (Signed)
 Has been asymptomatic, today reporting cold intolerance. Last TSH 8.58. Currently not on treatment. Will re-check with next blood work and decide about treatment accordingly.

## 2024-07-15 NOTE — Assessment & Plan Note (Signed)
 We discussed abdominal CT results. Pending appt with IR to discuses paracentesis and ascitic fluid analysis. Today Spironolactone 25 mg daily and Furosemide 20 mg bid started. Some side effects of meds discussed. BMP in 2-3 weeks. GI appt pending. He was clearly instructed about warning signs.

## 2024-07-15 NOTE — Patient Instructions (Addendum)
 A few things to remember from today's visit:  Essential (primary) hypertension - Plan: spironolactone (ALDACTONE) 25 MG tablet  Ascites due to alcoholic cirrhosis (HCC) - Plan: spironolactone (ALDACTONE) 25 MG tablet, furosemide (LASIX) 20 MG tablet Avoid alcohol. Start meds today. Labs in 2-3 weeks.  Monitor blood pressure at home and let me know about readings in 2 weeks.  If you need refills for medications you take chronically, please call your pharmacy. Do not use My Chart to request refills or for acute issues that need immediate attention. If you send a my chart message, it may take a few days to be addressed, specially if I am not in the office.  Please be sure medication list is accurate. If a new problem present, please set up appointment sooner than planned today.

## 2024-07-15 NOTE — Assessment & Plan Note (Signed)
 We discussed adverse effects of alcohol consumption. Reports drinking 4 beers per week, encouraged to wean alcohol off. He is not interested in [employer]

## 2024-07-15 NOTE — Progress Notes (Signed)
 Chief Complaint  Patient presents with   Medical Management of Chronic Issues    Six week follow-up    Discussed the use of AI scribe software for clinical note transcription with the patient, who gave verbal consent to proceed.  History of Present Illness [name]. is a 43 year old male who presents for follow up on recent OV, when he was reporting worsening diarrhea,RLQ abdominal pain, nausea, and vomiting. Symptoms have resolved.  Abdominal CT was obtain on 07/09/24: 1. Large amount of abdominopelvic ascites. 2. Numerous thickened, hyperdense loops of duodenum and jejunum which may represent sequelae associated with enteritis. 3. Evidence of prior left nephrectomy. 4. Mildly enlarged prostate gland. Correlation with PSA levels is recommended. 5. Aortic atherosclerosis.  He has ongoing abdominal bloating with noticeable distension on some days.   Bowel habits are back to his baseline, which he describes as 'not solid' and 'loose,' with one to three bowel movements daily. No blood or melena in stools and no fever.  His liver tests have been abnormal for several years, with elevated levels noted six years ago and consistently high since then.  Liver US  was ordered 06/03/24.  Lab Results  Component Value Date   ALT 9 06/29/2024   AST 73 (H) 06/29/2024   ALKPHOS 273 (H) 06/29/2024   BILITOT 1.8 (H) 06/29/2024   Lab Results  Component Value Date   WBC 8.5 06/29/2024   HGB 13.1 06/29/2024   HCT 39.5 06/29/2024   MCV 91 06/29/2024   PLT 140 (L) 06/29/2024   He has a significant history of alcohol consumption, drinking heavily from age 5 to 49, including hard liquor and beer. Currently, he consumes about four beers per week and denies any other alcohol or drug use.  + Tobacco use.  He denies difficulty breathing, chest pain,palpitations, orthopnea,PND, or edema. No change sin urinary frequency or  or urinary obstruction.  States that he frequently feels cold,  wearing a jacket and hoodie even in summer,which he attributes to hx of low platelets. and has a history of mildly abnormal thyroid  function tests.  No recent bruising or bleeding issues.  Family history includes an uncle with liver sclerosis.  Hypothyroidism: He is not on hormonal therapy at this time.  Lab Results  Component Value Date   TSH 8.58 (H) 06/03/2024   HTN: BP elevated today. Not on pharmacologic treatment. He is not monitoring BP at home. Lab Results  Component Value Date   NA 136 06/29/2024   CL 96 06/29/2024   K 4.7 06/29/2024   CO2 16 (L) 06/29/2024   BUN 6 06/29/2024   CREATININE 0.75 (L) 06/29/2024   EGFR 113 06/29/2024   CALCIUM  8.9 06/29/2024   ALBUMIN 3.3 (L) 06/29/2024   GLUCOSE 56 (L) 06/29/2024   Review of Systems  Constitutional:  Positive for fatigue. Negative for activity change, appetite change, chills and fever.  HENT:  Negative for sore throat and trouble swallowing.   Respiratory:  Negative for cough, shortness of breath and wheezing.   Cardiovascular:  Negative for chest pain and palpitations.  Gastrointestinal:  Negative for abdominal pain, nausea and vomiting.  Endocrine: Negative for heat intolerance.  Genitourinary:  Negative for decreased urine volume, dysuria and hematuria.  Skin:  Negative for rash.  Neurological:  Negative for syncope, facial asymmetry, weakness and headaches.  See other pertinent positives and negatives in HPI.  Current Outpatient Medications on File Prior to Visit  Medication Sig Dispense Refill  rosuvastatin  (CRESTOR ) 10 MG tablet Take 1 tablet (10 mg total) by mouth daily. 30 tablet 3   sertraline  (ZOLOFT ) 25 MG tablet Take 1 tablet (25 mg total) by mouth daily. 30 tablet 3   No current facility-administered medications on file prior to visit.   Past Medical History:  Diagnosis Date   Anxiety    Cancer [facility])    as a child   Depression    Diabetes mellitus without complication (HCC)    Hypertension     Renal disorder    No Known Allergies  Social History   Socioeconomic History   Marital status: Single    Spouse name: Not on file   Number of children: Not on file   Years of education: Not on file   Highest education level: Not on file  Occupational History   Not on file  Tobacco Use   Smoking status: Every Day    Current packs/day: 0.25    Types: Cigarettes   Smokeless tobacco: Never  Substance and Sexual Activity   Alcohol use: Yes    Comment: social   Drug use: Not on file   Sexual activity: Not Currently  Other Topics Concern   Not on file  Social History Narrative   Not on file   Social Drivers of Health   Financial Resource Strain: Not on file  Food Insecurity: Not on file  Transportation Needs: Not on file  Physical Activity: Not on file  Stress: Not on file  Social Connections: Not on file   Vitals:   07/15/24 1123 07/15/24 1233  BP: (!) 160/104 (!) 160/100  Pulse: 100   Resp: 16   Temp: 97.9 F (36.6 C)   SpO2: 99%    Wt Readings from Last 3 Encounters:  07/15/24 133 lb 6.4 oz (60.5 kg)  06/29/24 127 lb 9.6 oz (57.9 kg)  06/03/24 126 lb 4 oz (57.3 kg)   Body mass index is 22.9 kg/m.  Physical Exam Vitals and nursing note reviewed.  Constitutional:      General: He is not in acute distress.    Appearance: He is well-developed. He is not ill-appearing or toxic-appearing.  HENT:     Head: Normocephalic and atraumatic.     Mouth/Throat:     Mouth: Mucous membranes are moist.  Eyes:     Conjunctiva/sclera: Conjunctivae normal.  Neck:     Vascular: No JVD.  Cardiovascular:     Rate and Rhythm: Normal rate and regular rhythm.     Pulses:          Posterior tibial pulses are 2+ on the right side and 2+ on the left side.     Heart sounds: Murmur (? Soft SEM LUSB) heard.  Pulmonary:     Effort: Pulmonary effort is normal. No respiratory distress.     Breath sounds: Normal breath sounds.  Abdominal:     General: Bowel sounds are normal.  There is distension.     Palpations: Abdomen is soft. There is no mass.     Tenderness: There is no abdominal tenderness.  Musculoskeletal:     Right lower leg: 1+ Pitting Edema present.     Left lower leg: 1+ Pitting Edema present.  Skin:    General: Skin is warm.     Findings: No erythema.  Neurological:     General: No focal deficit present.     Mental Status: He is alert and oriented to person, place, and time.     Comments:  He uses a cane for assistance.  Psychiatric:        Mood and Affect: Mood and affect normal.    ASSESSMENT AND PLAN:  Mr. [name] was seen today for medical management of chronic issues.  Diagnoses and all orders for this visit:  Orders Placed This Encounter  Procedures   Basic metabolic panel with GFR   Hepatic function panel   Protime-INR   PSA   T4, free   TSH   Hepatitis C antibody   Hepatitis B surface antigen   Hepatitis B surface antibody,qualitative   ECHOCARDIOGRAM COMPLETE   Ascites due to alcoholic cirrhosis (HCC) Assessment & Plan: We discussed abdominal CT results. Pending appt with IR to discuses paracentesis and ascitic fluid analysis. Today Spironolactone 25 mg daily and Furosemide 20 mg bid started. Some side effects of meds discussed. Stop alcohol intake. BMP in 2-3 weeks. GI appt pending, when colonoscopy can also be discussed (for colon cancer screening). He was clearly instructed about warning signs.  Orders: -     Spironolactone; Take 1 tablet (25 mg total) by mouth daily.  Dispense: 90 tablet; Refill: 0 -     Furosemide; Take 1 tablet (20 mg total) by mouth 2 (two) times daily.  Dispense: 60 tablet; Refill: 3 -     Hepatic function panel; Future -     Protime-INR; Future -     Thiamine HCl; Take 1 tablet (100 mg total) by mouth daily.  Dispense: 90 tablet; Refill: 3  Essential (primary) hypertension Assessment & Plan: Problem has not been well controlled. We discussed possible complications of elevated  BP. Spironolactone 25 mg started today. BMP in 2-3 weeks. Recommend monitoring BP's at home and to let me know about reading in 2 weeks. A BB can be added if BP is not at goal. Instructed about warning signs.  Orders: -     Spironolactone; Take 1 tablet (25 mg total) by mouth daily.  Dispense: 90 tablet; Refill: 0 -     Basic metabolic panel with GFR; Future  Acquired hypothyroidism Assessment & Plan: Has been asymptomatic, today reporting cold intolerance. Last TSH 8.58. Currently not on treatment. Will re-check with next blood work and decide about treatment accordingly.  Orders: -     T4, free; Future -     TSH; Future  Uncomplicated alcohol dependence (HCC) Assessment & Plan: We discussed adverse effects of alcohol consumption. Reports drinking 4 beers per week, encouraged to wean alcohol off. He is not interested in [employer]  Enlarged prostate Reported on abdomen/pelvic CT. Asymptomatic.  -     PSA; Future  Abnormal LFTs -     Hepatitis C antibody; Future -     Hepatitis B surface antigen; Future -     Hepatitis B surface antibody,qualitative; Future  Heart murmur  Noted today soft. He is not having SOB,orthopnea,or PND but given recently dx'ed ascites, I think echo is warrant.  I personally spent a total of 43 minutes in the care of the patient today including preparing to see the patient, getting/reviewing separately obtained history, performing a medically appropriate exam/evaluation, counseling and educating, placing orders, documenting clinical information in the EHR, and communicating results.  Return in about 3 months (around 10/15/2024) for Labs in 2-3 weeks..  Keith Consolo G. [place], MD  [facility]. [place] office.

## 2024-07-20 ENCOUNTER — Ambulatory Visit (HOSPITAL_BASED_OUTPATIENT_CLINIC_OR_DEPARTMENT_OTHER)

## 2024-07-29 ENCOUNTER — Other Ambulatory Visit (INDEPENDENT_AMBULATORY_CARE_PROVIDER_SITE_OTHER)

## 2024-07-29 ENCOUNTER — Telehealth: Payer: Self-pay | Admitting: Family Medicine

## 2024-07-29 DIAGNOSIS — N400 Enlarged prostate without lower urinary tract symptoms: Secondary | ICD-10-CM

## 2024-07-29 DIAGNOSIS — R1084 Generalized abdominal pain: Secondary | ICD-10-CM | POA: Diagnosis not present

## 2024-07-29 DIAGNOSIS — R112 Nausea with vomiting, unspecified: Secondary | ICD-10-CM | POA: Diagnosis not present

## 2024-07-29 DIAGNOSIS — R7989 Other specified abnormal findings of blood chemistry: Secondary | ICD-10-CM

## 2024-07-29 DIAGNOSIS — I10 Essential (primary) hypertension: Secondary | ICD-10-CM

## 2024-07-29 DIAGNOSIS — K7031 Alcoholic cirrhosis of liver with ascites: Secondary | ICD-10-CM | POA: Diagnosis not present

## 2024-07-29 DIAGNOSIS — E039 Hypothyroidism, unspecified: Secondary | ICD-10-CM | POA: Diagnosis not present

## 2024-07-29 LAB — T4, FREE: Free T4: 1 ng/dL (ref 0.60–1.60)

## 2024-07-29 LAB — BASIC METABOLIC PANEL WITH GFR
BUN: 5 mg/dL — ABNORMAL LOW (ref 6–23)
CO2: 25 meq/L (ref 19–32)
Calcium: 8.7 mg/dL (ref 8.4–10.5)
Chloride: 88 meq/L — ABNORMAL LOW (ref 96–112)
Creatinine, Ser: 0.71 mg/dL (ref 0.40–1.50)
GFR: 111.1 mL/min (ref >60.00)
Glucose, Bld: 123 mg/dL — ABNORMAL HIGH (ref 70–99)
Potassium: 3 meq/L — ABNORMAL LOW (ref 3.5–5.1)
Sodium: 131 meq/L — ABNORMAL LOW (ref 135–145)

## 2024-07-29 LAB — HEPATIC FUNCTION PANEL
ALT: 9 U/L (ref 0–53)
AST: 46 U/L — ABNORMAL HIGH (ref 0–37)
Albumin: 3.3 g/dL — ABNORMAL LOW (ref 3.5–5.2)
Alkaline Phosphatase: 190 U/L — ABNORMAL HIGH (ref 39–117)
Bilirubin, Direct: 0.8 mg/dL — ABNORMAL HIGH (ref 0.0–0.3)
Total Bilirubin: 1.5 mg/dL — ABNORMAL HIGH (ref 0.2–1.2)
Total Protein: 8.5 g/dL — ABNORMAL HIGH (ref 6.0–8.3)

## 2024-07-29 LAB — TSH: TSH: 7.99 u[IU]/mL — ABNORMAL HIGH (ref 0.35–5.50)

## 2024-07-29 LAB — PSA: PSA: 2.33 ng/mL (ref 0.10–4.00)

## 2024-07-29 LAB — PROTIME-INR
INR: 1.3 ratio — ABNORMAL HIGH (ref 0.8–1.0)
Prothrombin Time: 14.1 s — ABNORMAL HIGH (ref 9.6–13.1)

## 2024-07-29 NOTE — Telephone Encounter (Signed)
 [facility] DSS Medical Examination form to be picked up--placed in dr's folder.  Cal pt at [phone] upon completion.

## 2024-07-29 NOTE — Addendum Note (Signed)
 Addended by: CASANDRA ODDIS HERO on: 07/29/2024 03:06 PM   Modules accepted: Orders

## 2024-07-30 LAB — C-REACTIVE PROTEIN: CRP: 65 mg/L — ABNORMAL HIGH (ref 0–10)

## 2024-08-01 ENCOUNTER — Ambulatory Visit (HOSPITAL_BASED_OUTPATIENT_CLINIC_OR_DEPARTMENT_OTHER)

## 2024-08-01 LAB — TEST AUTHORIZATION

## 2024-08-01 LAB — HEPATITIS C ANTIBODY: Hepatitis C Ab: NONREACTIVE

## 2024-08-01 LAB — HEPATITIS B SURFACE ANTIBODY,QUALITATIVE: Hep B S Ab: NONREACTIVE

## 2024-08-01 LAB — HEPATITIS B SURFACE ANTIGEN: Hepatitis B Surface Ag: NONREACTIVE

## 2024-08-01 LAB — PSA: PSA: 2.14 ng/mL (ref <=4.00)

## 2024-08-02 MED ORDER — POTASSIUM CHLORIDE CRYS ER 10 MEQ PO TBCR: 10.0000 meq | EXTENDED_RELEASE_TABLET | Freq: Two times a day (BID) | ORAL | 2 refills | Status: AC

## 2024-08-03 DIAGNOSIS — Z0279 Encounter for issue of other medical certificate: Secondary | ICD-10-CM

## 2024-08-03 MED ORDER — LEVOTHYROXINE SODIUM 25 MCG PO TABS: 25.0000 ug | ORAL_TABLET | Freq: Every day | ORAL | 3 refills | Status: AC

## 2024-08-03 NOTE — Telephone Encounter (Signed)
Form was completed and signed. Thanks, BJ 

## 2024-08-03 NOTE — Telephone Encounter (Signed)
 Patient notified paperwork is ready for pick up at front desk. Patient voiced understanding.

## 2024-08-03 NOTE — Telephone Encounter (Signed)
 Spoke with patient. Patient notified of results. Questions answered. Appointment for lab scheduled. Levothyroxine sent to pharmacy. Patient voiced understanding.

## 2024-08-15 ENCOUNTER — Ambulatory Visit (INDEPENDENT_AMBULATORY_CARE_PROVIDER_SITE_OTHER)

## 2024-08-15 DIAGNOSIS — R011 Cardiac murmur, unspecified: Secondary | ICD-10-CM | POA: Diagnosis not present

## 2024-08-16 LAB — ECHOCARDIOGRAM COMPLETE
Area-P 1/2: 5.27 cm2
S' Lateral: 2.21 cm

## 2024-08-18 ENCOUNTER — Ambulatory Visit (HOSPITAL_BASED_OUTPATIENT_CLINIC_OR_DEPARTMENT_OTHER)
Admission: RE | Admit: 2024-08-18 | Discharge: 2024-08-18 | Disposition: A | Discharge status: Home / Self Care | Source: Ambulatory Visit | Attending: Family Medicine | Admitting: Family Medicine

## 2024-08-18 DIAGNOSIS — K7460 Unspecified cirrhosis of liver: Secondary | ICD-10-CM | POA: Diagnosis not present

## 2024-08-18 DIAGNOSIS — R7989 Other specified abnormal findings of blood chemistry: Secondary | ICD-10-CM | POA: Insufficient documentation

## 2024-08-18 DIAGNOSIS — R188 Other ascites: Secondary | ICD-10-CM | POA: Diagnosis not present

## 2024-08-18 DIAGNOSIS — K838 Other specified diseases of biliary tract: Secondary | ICD-10-CM | POA: Diagnosis not present

## 2024-08-19 ENCOUNTER — Other Ambulatory Visit

## 2024-08-22 ENCOUNTER — Ambulatory Visit: Payer: Self-pay | Admitting: Family Medicine

## 2024-08-24 ENCOUNTER — Other Ambulatory Visit (INDEPENDENT_AMBULATORY_CARE_PROVIDER_SITE_OTHER)

## 2024-08-24 ENCOUNTER — Ambulatory Visit: Payer: Self-pay | Admitting: Family Medicine

## 2024-08-24 DIAGNOSIS — E876 Hypokalemia: Secondary | ICD-10-CM

## 2024-08-24 LAB — BASIC METABOLIC PANEL WITH GFR
BUN: 3 mg/dL — ABNORMAL LOW (ref 6–23)
CO2: 27 meq/L (ref 19–32)
Calcium: 8.8 mg/dL (ref 8.4–10.5)
Chloride: 93 meq/L — ABNORMAL LOW (ref 96–112)
Creatinine, Ser: 0.53 mg/dL (ref 0.40–1.50)
GFR: 121.29 mL/min (ref >60.00)
Glucose, Bld: 104 mg/dL — ABNORMAL HIGH (ref 70–99)
Potassium: 3.4 meq/L — ABNORMAL LOW (ref 3.5–5.1)
Sodium: 130 meq/L — ABNORMAL LOW (ref 135–145)

## 2024-10-11 ENCOUNTER — Encounter: Payer: Self-pay | Admitting: Gastroenterology

## 2024-10-21 ENCOUNTER — Encounter: Payer: Self-pay | Admitting: Family Medicine

## 2024-10-21 ENCOUNTER — Ambulatory Visit: Admitting: Family Medicine

## 2024-10-21 VITALS — BP 120/80 | HR 100 | Temp 97.5°F | Resp 16 | Ht 64.0 in | Wt 121.0 lb

## 2024-10-21 DIAGNOSIS — E876 Hypokalemia: Secondary | ICD-10-CM

## 2024-10-21 DIAGNOSIS — F332 Major depressive disorder, recurrent severe without psychotic features: Secondary | ICD-10-CM | POA: Diagnosis not present

## 2024-10-21 DIAGNOSIS — K7031 Alcoholic cirrhosis of liver with ascites: Secondary | ICD-10-CM | POA: Diagnosis not present

## 2024-10-21 DIAGNOSIS — H6993 Unspecified Eustachian tube disorder, bilateral: Secondary | ICD-10-CM | POA: Diagnosis not present

## 2024-10-21 DIAGNOSIS — E039 Hypothyroidism, unspecified: Secondary | ICD-10-CM | POA: Diagnosis not present

## 2024-10-21 DIAGNOSIS — E1169 Type 2 diabetes mellitus with other specified complication: Secondary | ICD-10-CM | POA: Diagnosis not present

## 2024-10-21 DIAGNOSIS — R1084 Generalized abdominal pain: Secondary | ICD-10-CM

## 2024-10-21 DIAGNOSIS — E785 Hyperlipidemia, unspecified: Secondary | ICD-10-CM | POA: Diagnosis not present

## 2024-10-21 DIAGNOSIS — F1020 Alcohol dependence, uncomplicated: Secondary | ICD-10-CM

## 2024-10-21 DIAGNOSIS — I10 Essential (primary) hypertension: Secondary | ICD-10-CM

## 2024-10-21 LAB — CBC WITH DIFFERENTIAL/PLATELET
Basophils Absolute: 0.1 K/uL (ref 0.0–0.1)
Basophils Relative: 0.8 % (ref 0.0–3.0)
Eosinophils Absolute: 0.1 K/uL (ref 0.0–0.7)
Eosinophils Relative: 1 % (ref 0.0–5.0)
HCT: 42.1 % (ref 39.0–52.0)
Hemoglobin: 14.2 g/dL (ref 13.0–17.0)
Lymphocytes Relative: 30.8 % (ref 12.0–46.0)
Lymphs Abs: 2.9 K/uL (ref 0.7–4.0)
MCHC: 33.9 g/dL (ref 30.0–36.0)
MCV: 82.8 fl (ref 78.0–100.0)
Monocytes Absolute: 0.8 K/uL (ref 0.1–1.0)
Monocytes Relative: 8.3 % (ref 3.0–12.0)
Neutro Abs: 5.6 K/uL (ref 1.4–7.7)
Neutrophils Relative %: 59.1 % (ref 43.0–77.0)
Platelets: 278 K/uL (ref 150.0–400.0)
RBC: 5.08 Mil/uL (ref 4.22–5.81)
RDW: 15.6 % — ABNORMAL HIGH (ref 11.5–15.5)
WBC: 9.5 K/uL (ref 4.0–10.5)

## 2024-10-21 LAB — BASIC METABOLIC PANEL WITH GFR
BUN: 6 mg/dL (ref 6–23)
CO2: 24 meq/L (ref 19–32)
Calcium: 9.5 mg/dL (ref 8.4–10.5)
Chloride: 86 meq/L — ABNORMAL LOW (ref 96–112)
Creatinine, Ser: 0.67 mg/dL (ref 0.40–1.50)
GFR: 112.88 mL/min
Glucose, Bld: 119 mg/dL — ABNORMAL HIGH (ref 70–99)
Potassium: 4.3 meq/L (ref 3.5–5.1)
Sodium: 122 meq/L — ABNORMAL LOW (ref 135–145)

## 2024-10-21 LAB — POCT GLYCOSYLATED HEMOGLOBIN (HGB A1C): Hemoglobin A1C: 6.9 % — AB (ref 4.0–5.6)

## 2024-10-21 LAB — LIPID PANEL
Cholesterol: 156 mg/dL (ref 28–200)
HDL: 45.1 mg/dL
LDL Cholesterol: 90 mg/dL (ref 10–99)
NonHDL: 111.36
Total CHOL/HDL Ratio: 3
Triglycerides: 105 mg/dL (ref 10.0–149.0)
VLDL: 21 mg/dL (ref 0.0–40.0)

## 2024-10-21 LAB — MICROALBUMIN / CREATININE URINE RATIO
Creatinine,U: 22.2 mg/dL
Microalb Creat Ratio: 36.3 mg/g — ABNORMAL HIGH (ref 0.0–30.0)
Microalb, Ur: 0.8 mg/dL (ref 0.7–1.9)

## 2024-10-21 MED ORDER — ACCU-CHEK AVIVA PLUS W/DEVICE KIT: PACK | 0 refills | Status: AC

## 2024-10-21 NOTE — Assessment & Plan Note (Signed)
 Currently he is on rosuvastatin  10 mg daily. Continue low-fat diet. Further recommendation will be given according to lipid panel result.

## 2024-10-21 NOTE — Progress Notes (Signed)
 "   Chief Complaint  Patient presents with   Follow-up    3 month follow-up   Discussed the use of AI scribe software for clinical note transcription with the patient, who gave verbal consent to proceed.  History of Present Illness [name]. is a 44 year old male with PMHx significant for liver cirrhosis with ascitis,HTN,DM II, HLD, and alcohol dependency liver here today for follow up. He was last seen on 07/15/24. No new problems since his last visit.  Liver cirrhosis with ascites: He has experienced abdominal achiness for the past three days, describing it as a nuisance rather than severe pain, generalized, with a pain level of 5 out of 10. He has not taken any medication for the pain due to concerns about interactions with his current medications. The achiness improves slightly after bowel movements, which occur two to three times daily.   No blood in stool or urine, fever, chills, nausea, or vomiting. He did not receive appt information from interventional radiologist.  He notes a sensation of abdominal distension, observing that his abdomen appears larger and he can hear increased bowel sounds after eating or drinking. He is taking furosemide  20 mg once daily and Spironolactone  25 mg daily.  He continues to consume two beers daily.  He has an appt scheduled with GI. He has not noted jaundice.  Abdominal CT was obtain on 07/09/24: 1. Large amount of abdominopelvic ascites. 2. Numerous thickened, hyperdense loops of duodenum and jejunum which may represent sequelae associated with enteritis. 3. Evidence of prior left nephrectomy. 4. Mildly enlarged prostate gland. Correlation with PSA levels is recommended. 5. Aortic atherosclerosis.  -He describes a sensation of fullness and occasional tinnitus, particularly in the left ear, ongoing for a couple of months. No recent colds, ear pain, or drainage. He has not tried any over-the-counter treatments for this  issue.  -Hypertension:  Negative for unusual or severe headache, visual changes, exertional chest pain, dyspnea,  focal weakness, or edema. HypoK+ on KLOR 10 meq bid.  Lab Results  Component Value Date   CREATININE 0.53 08/24/2024   BUN 3 (L) 08/24/2024   NA 130 (L) 08/24/2024   K 3.4 (L) 08/24/2024   CL 93 (L) 08/24/2024   CO2 27 08/24/2024   Diabetes Mellitus II: Dx's about 10+ years ago. On non pharmacologic treatment. He is not monitoring BS's. He has not been monitoring his blood sugar at home due to expired testing supplies. Negative for symptoms of hypoglycemia, polyuria, polydipsia, foot ulcers/trauma Occasional feet tingling/numbness.  Lab Results  Component Value Date   HGBA1C 6.6 (H) 06/03/2024   Hyperlipidemia:He is on Rosuvastatin  10 mg daily. Lab Results  Component Value Date   CHOL 230 (H) 08/25/2023   HDL 97.30 08/25/2023   LDLCALC 113 (H) 08/25/2023   TRIG 97.0 08/25/2023   CHOLHDL 2 08/25/2023   Lab Results  Component Value Date   ALT 9 07/29/2024   AST 46 (H) 07/29/2024   ALKPHOS 190 (H) 07/29/2024   BILITOT 1.5 (H) 07/29/2024   Hypothyroidism: He is taking Levothyroxine  25 mcg daily with his other meds and with food. Lab Results  Component Value Date   TSH 7.99 (H) 07/29/2024   Anxiety and depression: He is not taking Sertraline  25 mg daily.     10/21/2024   11:49 AM 06/03/2024   11:48 PM  Depression screen PHQ 2/9  Decreased Interest 2 3  Down, Depressed, Hopeless 3 3  PHQ - 2 Score 5 6  Altered sleeping 3 3  Tired, decreased energy 2 3  Change in appetite 1 3  Feeling bad or failure about yourself  1 3  Trouble concentrating 0 3  Moving slowly or fidgety/restless 0 3  Suicidal thoughts 0 1  PHQ-9 Score 12 25   Difficult doing work/chores Somewhat difficult Very difficult     Data saved with a previous flowsheet row definition      10/21/2024    2:23 PM 06/03/2024   11:49 PM  GAD 7 : Generalized Anxiety Score  Nervous, Anxious,  on Edge 1 3   Control/stop worrying 1 3   Worry too much - different things 1 3   Trouble relaxing 1 3   Restless 1 3   Easily annoyed or irritable 1 3   Afraid - awful might happen 1 3   Total GAD 7 Score 7 21  Anxiety Difficulty Somewhat difficult Very difficult     Data saved with a previous flowsheet row definition    Review of Systems  Constitutional:  Negative for activity change, appetite change, chills and fever.  HENT:  Negative for sore throat and trouble swallowing.   Respiratory:  Negative for cough and wheezing.   Endocrine: Negative for cold intolerance and heat intolerance.  Genitourinary:  Negative for decreased urine volume and dysuria.  Skin:  Negative for rash.  Neurological:  Negative for syncope and facial asymmetry.  Psychiatric/Behavioral:  Negative for confusion and hallucinations.   See other pertinent positives and negatives in HPI.  Medications Ordered Prior to Encounter[1]  Past Medical History:  Diagnosis Date   Anxiety    Cancer (HCC)    as a child   Depression    Diabetes mellitus without complication (HCC)    Hypertension    Renal disorder    Allergies[2]  Social History   Socioeconomic History   Marital status: Single    Spouse name: Not on file   Number of children: Not on file   Years of education: Not on file   Highest education level: Not on file  Occupational History   Not on file  Tobacco Use   Smoking status: Every Day    Current packs/day: 0.25    Types: Cigarettes   Smokeless tobacco: Never  Substance and Sexual Activity   Alcohol use: Yes    Comment: social   Drug use: Not on file   Sexual activity: Not Currently  Other Topics Concern   Not on file  Social History Narrative   Not on file   Social Drivers of Health   Tobacco Use: High Risk (10/21/2024)   Patient History    Smoking Tobacco Use: Every Day    Smokeless Tobacco Use: Never    Passive Exposure: Not on file  Financial Resource Strain: Not on file   Food Insecurity: Not on file  Transportation Needs: Not on file  Physical Activity: Not on file  Stress: Not on file  Social Connections: Not on file  Depression (PHQ2-9): High Risk (10/21/2024)   Depression (PHQ2-9)    PHQ-2 Score: 12  Alcohol Screen: Not on file  Housing: Not on file  Utilities: Not on file  Health Literacy: Not on file    Today's Vitals   10/21/24 1154  BP: 120/80  Pulse: 100  Resp: 16  Temp: (!) 97.5 F (36.4 C)  SpO2: 96%  Weight: 121 lb (54.9 kg)  Height: 5' 4 (1.626 m)   Body mass index is 20.77 kg/m.  Physical  Exam Vitals and nursing note reviewed.  Constitutional:      General: He is not in acute distress.    Appearance: He is well-developed.  HENT:     Head: Normocephalic and atraumatic.  Eyes:     Conjunctiva/sclera: Conjunctivae normal.  Cardiovascular:     Rate and Rhythm: Normal rate and regular rhythm.     Pulses:          Dorsalis pedis pulses are 2+ on the right side and 2+ on the left side.     Heart sounds: No murmur heard. Pulmonary:     Effort: Pulmonary effort is normal. No respiratory distress.     Breath sounds: Normal breath sounds.  Abdominal:     General: There is distension.     Palpations: Abdomen is soft. There is hepatomegaly (?). There is no mass.     Tenderness: There is no abdominal tenderness.  Lymphadenopathy:     Cervical: No cervical adenopathy.  Skin:    General: Skin is warm.     Findings: No erythema or rash.  Neurological:     General: No focal deficit present.     Mental Status: He is alert and oriented to person, place, and time.     Comments: Stable gait, not assisted.  Psychiatric:        Mood and Affect: Mood and affect normal.    ASSESSMENT AND PLAN:  Mr. Salmaan Patchin was seen today for follow-up.  Diagnoses and all orders for this visit:  Orders Placed This Encounter  Procedures   Microalbumin / creatinine urine ratio   Basic metabolic panel with GFR   CBC with  Differential/Platelet   Lipid panel   Amb Referral to Nutrition and Diabetic Education   POC HgB A1c   Lab Results  Component Value Date   HGBA1C 6.9 (A) 10/21/2024   Lab Results  Component Value Date   MICROALBUR 0.8 10/21/2024   Lab Results  Component Value Date   NA 122 (L) 10/21/2024   CL 86 (L) 10/21/2024   K 4.3 10/21/2024   CO2 24 10/21/2024   BUN 6 10/21/2024   CREATININE 0.67 10/21/2024   GFR 112.88 10/21/2024   CALCIUM  9.5 10/21/2024   ALBUMIN 3.3 (L) 07/29/2024   GLUCOSE 119 (H) 10/21/2024   Lab Results  Component Value Date   CHOL 156 10/21/2024   HDL 45.10 10/21/2024   LDLCALC 90 10/21/2024   TRIG 105.0 10/21/2024   CHOLHDL 3 10/21/2024   Lab Results  Component Value Date   WBC 9.5 10/21/2024   HGB 14.2 10/21/2024   HCT 42.1 10/21/2024   MCV 82.8 10/21/2024   PLT 278.0 10/21/2024   Ascites due to alcoholic cirrhosis (HCC) Assessment & Plan: Did not receive call from IR. He has an appointment with gastroenterologist in 10/2024. Stressed the importance of avoiding alcohol intake. Instructed about warning signs. Continue Furosemide  20 mg daily and Spironolactone  25 mg daily.  Orders: -     Furosemide ; Take 1 tablet (20 mg total) by mouth daily.  Dispense: 90 tablet; Refill: 2  Generalized abdominal pain No signs of acute abdomen. Could be related to ascites. I do not think imaging is needed today. Monitor for new symptoms. Instructed about warning signs.  -     CBC with Differential/Platelet; Future  Uncomplicated alcohol dependence (HCC) Assessment & Plan: Still drinking 2 beers daily. Encouraged to continue working on weaning off alcohol.  Essential (primary) hypertension Assessment & Plan: BP adequately controlled. Recommend  monitoring BP at home. Currently he is on spironolactone  25 mg daily for ascites. Continue low-salt diet.  Severe episode of recurrent major depressive disorder, without psychotic features (HCC) Assessment &  Plan: He is not taking sertraline  25 mg daily, he is taking it every other day or so. We discussed some side effects of medication. He prefers to start medication now, we can resume later on if problem gets worse.  Hypokalemia Continue KLOR 10 meq bid.  -     Basic metabolic panel with GFR; Future  Type 2 diabetes mellitus with other specified complication, without long-term current use of insulin  (HCC) Assessment & Plan: HgA1C went up some, 6.9. Continue non pharmacologic treatment. We discussed dietary recommendations, has increased sugar intake during the holidays. Annual eye exam, periodic dental and foot care recommended. Declined foot exam today. F/U in 4 months.  Orders: -     Microalbumin / creatinine urine ratio; Future -     POCT glycosylated hemoglobin (Hb A1C) -     Accu-Chek Aviva Plus; As directed.  Dispense: 1 kit; Refill: 0 -     Amb Referral to Nutrition and Diabetic Education  Hyperlipidemia, unspecified hyperlipidemia type Assessment & Plan: Currently he is on rosuvastatin  10 mg daily. Continue low-fat diet. Further recommendation will be given according to lipid panel result.  Orders: -     Lipid panel; Future  Dysfunction of both eustachian tubes We discussed Dx and treatment options. Autoinflation maneuvers a few times during the day recommedned. Instructed about warning signs.  Acquired hypothyroidism Assessment & Plan: TSH 7.9 in 06/2024. He has not been taking levothyroxine  25 mcg with empty stomach, taking it with food and with other meds. We will plan on checking TSH next visit.  I personally spent a total of 45 minutes in the care of the patient today including preparing to see the patient, getting/reviewing separately obtained history, performing a medically appropriate exam/evaluation, counseling and educating, and documenting clinical information in the EHR.  Return in about 17 weeks (around 02/17/2025) for chronic problems.   Amabel Stmarie, MD [facility]. [place] office.      [1]  Current Outpatient Medications on File Prior to Visit  Medication Sig Dispense Refill   furosemide  (LASIX ) 20 MG tablet Take 1 tablet (20 mg total) by mouth 2 (two) times daily. 60 tablet 3   levothyroxine  (SYNTHROID ) 25 MCG tablet Take 1 tablet (25 mcg total) by mouth daily. 90 tablet 3   potassium chloride  (KLOR-CON  M) 10 MEQ tablet Take 1 tablet (10 mEq total) by mouth 2 (two) times daily. 60 tablet 2   rosuvastatin  (CRESTOR ) 10 MG tablet Take 1 tablet (10 mg total) by mouth daily. 30 tablet 3   spironolactone  (ALDACTONE ) 25 MG tablet Take 1 tablet (25 mg total) by mouth daily. 90 tablet 0   thiamine  (VITAMIN B1) 100 MG tablet Take 1 tablet (100 mg total) by mouth daily. 90 tablet 3   No current facility-administered medications on file prior to visit.  [2] No Known Allergies  "

## 2024-10-21 NOTE — Patient Instructions (Addendum)
 A few things to remember from today's visit:  Ascites due to alcoholic cirrhosis (HCC) - Plan: CANCELED: CBC with Differential/Platelet, CANCELED: Hepatic function panel, CANCELED: Protime-INR  Uncomplicated alcohol dependence (HCC)  Severe episode of recurrent major depressive disorder, without psychotic features (HCC)  Hypokalemia - Plan: Basic metabolic panel with GFR  Type 2 diabetes mellitus with other specified complication, without long-term current use of insulin  (HCC) - Plan: Microalbumin / creatinine urine ratio, POC HgB A1c, Blood Glucose Monitoring Suppl (ACCU-CHEK AVIVA PLUS) w/Device KIT  Generalized abdominal pain - Plan: CBC with Differential/Platelet  Hyperlipidemia, unspecified hyperlipidemia type - Plan: Lipid panel  No changes today. Keep appt with gastroenterologist. You could take Acetaminophen but no more than 4 tab of 500 mg in a day. No Ibuprofen or naproxen . Continue working on alcohol cessation. You are overdue for eye exam.   If you need refills for medications you take chronically, please call your pharmacy. Do not use My Chart to request refills or for acute issues that need immediate attention. If you send a my chart message, it may take a few days to be addressed, specially if I am not in the office.  Please be sure medication list is accurate. If a new problem present, please set up appointment sooner than planned today.

## 2024-10-21 NOTE — Assessment & Plan Note (Addendum)
 Still drinking 2 beers daily. Encouraged to continue working on weaning off alcohol.

## 2024-10-21 NOTE — Assessment & Plan Note (Signed)
 He is not taking sertraline  25 mg daily, he is taking it every other day or so. We discussed some side effects of medication. He prefers to start medication now, we can resume later on if problem gets worse.

## 2024-10-21 NOTE — Assessment & Plan Note (Addendum)
 Did not receive call from IR. He has an appointment with gastroenterologist in 10/2024. Stressed the importance of avoiding alcohol intake. Instructed about warning signs. Continue Furosemide  20 mg daily and Spironolactone  25 mg daily.

## 2024-10-21 NOTE — Assessment & Plan Note (Signed)
 TSH 7.9 in 06/2024. He has not been taking levothyroxine  25 mcg with empty stomach, taking it with food and with other meds. We will plan on checking TSH next visit.

## 2024-10-21 NOTE — Assessment & Plan Note (Signed)
 BP adequately controlled. Recommend monitoring BP at home. Currently he is on spironolactone  25 mg daily for ascites. Continue low-salt diet.

## 2024-10-23 ENCOUNTER — Ambulatory Visit: Payer: Self-pay | Admitting: Family Medicine

## 2024-10-23 ENCOUNTER — Encounter: Payer: Self-pay | Admitting: Family Medicine

## 2024-10-23 DIAGNOSIS — E871 Hypo-osmolality and hyponatremia: Secondary | ICD-10-CM

## 2024-10-23 MED ORDER — FUROSEMIDE 20 MG PO TABS: 20.0000 mg | ORAL_TABLET | Freq: Every day | ORAL | 2 refills | Status: AC

## 2024-10-23 NOTE — Assessment & Plan Note (Signed)
 HgA1C went up some, 6.9. Continue non pharmacologic treatment. We discussed dietary recommendations, has increased sugar intake during the holidays. Annual eye exam, periodic dental and foot care recommended. Declined foot exam today. F/U in 4 months.

## 2024-10-24 NOTE — Telephone Encounter (Signed)
 This encounter was created in error - please disregard.

## 2024-11-01 ENCOUNTER — Other Ambulatory Visit: Payer: Self-pay | Admitting: Family Medicine

## 2024-11-01 DIAGNOSIS — I10 Essential (primary) hypertension: Secondary | ICD-10-CM

## 2024-11-01 DIAGNOSIS — K7031 Alcoholic cirrhosis of liver with ascites: Secondary | ICD-10-CM

## 2024-11-02 ENCOUNTER — Telehealth: Payer: Self-pay

## 2024-11-02 ENCOUNTER — Telehealth: Payer: Self-pay | Admitting: *Deleted

## 2024-11-02 DIAGNOSIS — E785 Hyperlipidemia, unspecified: Secondary | ICD-10-CM

## 2024-11-02 DIAGNOSIS — E1169 Type 2 diabetes mellitus with other specified complication: Secondary | ICD-10-CM

## 2024-11-02 MED ORDER — ACCU-CHEK SOFTCLIX LANCETS MISC: 12 refills | Status: AC

## 2024-11-02 MED ORDER — ROSUVASTATIN CALCIUM 10 MG PO TABS: 10.0000 mg | ORAL_TABLET | Freq: Every day | ORAL | 3 refills | Status: AC

## 2024-11-02 MED ORDER — GLUCOSE BLOOD VI STRP: 1.0000 | ORAL_STRIP | Freq: Two times a day (BID) | 3 refills | Status: AC

## 2024-11-02 NOTE — Addendum Note (Signed)
 Addended by: VANICE LUCIENNE PARAS on: 11/02/2024 12:10 PM   Modules accepted: Orders

## 2024-11-02 NOTE — Telephone Encounter (Signed)
 Copied from CRM [id number]. Topic: Clinical - Prescription Issue >> Nov 02, 2024 11:08 AM Alfonso ORN wrote: Reason for CRM:pt stated that  Blood Glucose Monitoring Suppl (ACCU-CHEK AVIVA PLUS) w/Device KIT didn't come with the monitoring strips to prick finger with and would like a rx sent to pharmacy for the strips. Please call to confirm Callback: [phone]

## 2024-11-02 NOTE — Telephone Encounter (Signed)
 Copied from CRM [id number]. Topic: Clinical - Lab/Test Results >> Nov 02, 2024 11:20 AM Miquel SAILOR wrote: Reason for CRM: PT returning call. No notes but he stated he has lab results from 01/23 he would like to go over with office due to does not understand it. Gave info and transferred  [phone]

## 2024-11-02 NOTE — Addendum Note (Signed)
 Addended by: VANICE LUCIENNE PARAS on: 11/02/2024 12:12 PM   Modules accepted: Orders

## 2024-11-02 NOTE — Telephone Encounter (Signed)
 Spoke with the patient and I explained his lab results to him. Sent in lancets , strips and Crestor  in to the pharmacy. Patient did not have any more  questions or concerns at this time.

## 2024-11-02 NOTE — Telephone Encounter (Signed)
 Copied from CRM [id number]. Topic: Clinical - Lab/Test Results >> Nov 02, 2024 11:30 AM Terri G wrote: Reason for CRM: Patient is requesting a callback for his results. Stated he didn't understand the medical terms and wanted someone to go over them with him. Callback number  [phone]

## 2024-11-03 ENCOUNTER — Ambulatory Visit: Admitting: Gastroenterology

## 2024-11-28 ENCOUNTER — Ambulatory Visit: Admitting: Physician Assistant

## 2025-02-17 ENCOUNTER — Ambulatory Visit: Admitting: Family Medicine
